# Patient Record
Sex: Male | Born: 1945 | Race: Black or African American | Hispanic: No | Marital: Married | Smoking: Never smoker
Health system: Southern US, Community
[De-identification: ages and names within clinical notes are randomized; demographics above are authoritative.]

## PROBLEM LIST (undated history)

## (undated) DIAGNOSIS — Z7409 Other reduced mobility: Secondary | ICD-10-CM

## (undated) DIAGNOSIS — M1990 Unspecified osteoarthritis, unspecified site: Secondary | ICD-10-CM

## (undated) DIAGNOSIS — D649 Anemia, unspecified: Secondary | ICD-10-CM

## (undated) DIAGNOSIS — R7303 Prediabetes: Secondary | ICD-10-CM

## (undated) DIAGNOSIS — I2510 Atherosclerotic heart disease of native coronary artery without angina pectoris: Secondary | ICD-10-CM

## (undated) DIAGNOSIS — K219 Gastro-esophageal reflux disease without esophagitis: Secondary | ICD-10-CM

## (undated) DIAGNOSIS — Z96652 Presence of left artificial knee joint: Secondary | ICD-10-CM

## (undated) DIAGNOSIS — K4090 Unilateral inguinal hernia, without obstruction or gangrene, not specified as recurrent: Secondary | ICD-10-CM

## (undated) DIAGNOSIS — I5032 Chronic diastolic (congestive) heart failure: Secondary | ICD-10-CM

## (undated) DIAGNOSIS — I10 Essential (primary) hypertension: Secondary | ICD-10-CM

## (undated) DIAGNOSIS — N529 Male erectile dysfunction, unspecified: Secondary | ICD-10-CM

## (undated) DIAGNOSIS — I890 Lymphedema, not elsewhere classified: Secondary | ICD-10-CM

## (undated) DIAGNOSIS — I739 Peripheral vascular disease, unspecified: Secondary | ICD-10-CM

## (undated) DIAGNOSIS — Z952 Presence of prosthetic heart valve: Secondary | ICD-10-CM

## (undated) DIAGNOSIS — E7800 Pure hypercholesterolemia, unspecified: Secondary | ICD-10-CM

## (undated) DIAGNOSIS — E559 Vitamin D deficiency, unspecified: Secondary | ICD-10-CM

## (undated) DIAGNOSIS — I350 Nonrheumatic aortic (valve) stenosis: Secondary | ICD-10-CM

## (undated) DIAGNOSIS — Z89511 Acquired absence of right leg below knee: Secondary | ICD-10-CM

## (undated) HISTORY — DX: Morbid (severe) obesity due to excess calories: E66.01

## (undated) HISTORY — PX: HERNIA REPAIR: SHX51

## (undated) HISTORY — DX: Presence of left artificial knee joint: Z96.652

## (undated) HISTORY — PX: TOTAL KNEE ARTHROPLASTY: SHX125

## (undated) HISTORY — DX: Vitamin D deficiency, unspecified: E55.9

## (undated) HISTORY — DX: Atherosclerotic heart disease of native coronary artery without angina pectoris: I25.10

## (undated) HISTORY — DX: Prediabetes: R73.03

## (undated) HISTORY — DX: Male erectile dysfunction, unspecified: N52.9

## (undated) HISTORY — DX: Peripheral vascular disease, unspecified: I73.9

## (undated) HISTORY — DX: Lymphedema, not elsewhere classified: I89.0

## (undated) HISTORY — DX: Gastro-esophageal reflux disease without esophagitis: K21.9

## (undated) HISTORY — DX: Acquired absence of right leg below knee: Z89.511

## (undated) HISTORY — PX: JOINT REPLACEMENT: SHX530

---
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
## 2014-02-06 HISTORY — PX: ORIF PROXIMAL TIBIAL PLATEAU FRACTURE: SUR953

## 2014-02-06 HISTORY — PX: ORIF HUMERUS FRACTURE: SHX2126

## 2014-11-05 DIAGNOSIS — T8189XS Other complications of procedures, not elsewhere classified, sequela: Secondary | ICD-10-CM | POA: Insufficient documentation

## 2015-02-02 DIAGNOSIS — D649 Anemia, unspecified: Secondary | ICD-10-CM | POA: Insufficient documentation

## 2015-02-02 DIAGNOSIS — R011 Cardiac murmur, unspecified: Secondary | ICD-10-CM | POA: Insufficient documentation

## 2015-02-02 DIAGNOSIS — L0390 Cellulitis, unspecified: Secondary | ICD-10-CM | POA: Insufficient documentation

## 2015-02-02 DIAGNOSIS — I10 Essential (primary) hypertension: Secondary | ICD-10-CM

## 2015-02-02 DIAGNOSIS — I890 Lymphedema, not elsewhere classified: Secondary | ICD-10-CM

## 2015-02-02 DIAGNOSIS — I739 Peripheral vascular disease, unspecified: Secondary | ICD-10-CM

## 2015-02-02 HISTORY — DX: Anemia, unspecified: D64.9

## 2015-02-02 HISTORY — DX: Essential (primary) hypertension: I10

## 2015-02-06 DIAGNOSIS — M869 Osteomyelitis, unspecified: Secondary | ICD-10-CM | POA: Insufficient documentation

## 2015-02-07 HISTORY — PX: BELOW KNEE LEG AMPUTATION: SUR23

## 2015-02-09 DIAGNOSIS — I350 Nonrheumatic aortic (valve) stenosis: Secondary | ICD-10-CM

## 2015-03-10 DIAGNOSIS — Z89511 Acquired absence of right leg below knee: Secondary | ICD-10-CM

## 2015-07-28 DIAGNOSIS — Z89511 Acquired absence of right leg below knee: Secondary | ICD-10-CM

## 2015-07-28 DIAGNOSIS — Z4781 Encounter for orthopedic aftercare following surgical amputation: Secondary | ICD-10-CM | POA: Insufficient documentation

## 2015-08-29 DIAGNOSIS — E876 Hypokalemia: Secondary | ICD-10-CM | POA: Insufficient documentation

## 2015-08-29 DIAGNOSIS — Z96652 Presence of left artificial knee joint: Secondary | ICD-10-CM

## 2015-08-29 DIAGNOSIS — E7800 Pure hypercholesterolemia, unspecified: Secondary | ICD-10-CM | POA: Insufficient documentation

## 2015-08-29 DIAGNOSIS — S88111A Complete traumatic amputation at level between knee and ankle, right lower leg, initial encounter: Secondary | ICD-10-CM | POA: Insufficient documentation

## 2015-08-29 DIAGNOSIS — E559 Vitamin D deficiency, unspecified: Secondary | ICD-10-CM

## 2015-08-29 DIAGNOSIS — M150 Primary generalized (osteo)arthritis: Secondary | ICD-10-CM

## 2015-08-29 DIAGNOSIS — D531 Other megaloblastic anemias, not elsewhere classified: Secondary | ICD-10-CM

## 2015-08-29 DIAGNOSIS — I5032 Chronic diastolic (congestive) heart failure: Secondary | ICD-10-CM

## 2015-08-29 DIAGNOSIS — N529 Male erectile dysfunction, unspecified: Secondary | ICD-10-CM

## 2015-08-29 DIAGNOSIS — K219 Gastro-esophageal reflux disease without esophagitis: Secondary | ICD-10-CM

## 2015-08-29 HISTORY — DX: Pure hypercholesterolemia, unspecified: E78.00

## 2015-08-29 HISTORY — DX: Chronic diastolic (congestive) heart failure: I50.32

## 2015-12-13 DIAGNOSIS — Z993 Dependence on wheelchair: Secondary | ICD-10-CM

## 2015-12-13 HISTORY — DX: Dependence on wheelchair: Z99.3

## 2016-05-29 DIAGNOSIS — I872 Venous insufficiency (chronic) (peripheral): Secondary | ICD-10-CM | POA: Insufficient documentation

## 2016-05-29 DIAGNOSIS — R609 Edema, unspecified: Secondary | ICD-10-CM

## 2016-05-29 DIAGNOSIS — R600 Localized edema: Secondary | ICD-10-CM | POA: Insufficient documentation

## 2016-08-28 DIAGNOSIS — Z2821 Immunization not carried out because of patient refusal: Secondary | ICD-10-CM

## 2016-08-28 HISTORY — DX: Immunization not carried out because of patient refusal: Z28.21

## 2017-01-09 DIAGNOSIS — I2510 Atherosclerotic heart disease of native coronary artery without angina pectoris: Principal | ICD-10-CM

## 2017-07-17 DIAGNOSIS — Z6841 Body Mass Index (BMI) 40.0 and over, adult: Secondary | ICD-10-CM | POA: Insufficient documentation

## 2017-07-17 HISTORY — DX: Body Mass Index (BMI) 40.0 and over, adult: Z684

## 2018-03-14 DIAGNOSIS — R7303 Prediabetes: Secondary | ICD-10-CM

## 2018-04-12 ENCOUNTER — Encounter: Payer: Self-pay | Admitting: Cardiology

## 2018-04-12 ENCOUNTER — Ambulatory Visit (INDEPENDENT_AMBULATORY_CARE_PROVIDER_SITE_OTHER): Payer: Medicare PPO | Admitting: Cardiology

## 2018-04-12 VITALS — BP 122/66 | HR 66 | Ht 75.0 in | Wt 330.0 lb

## 2018-04-12 DIAGNOSIS — I2510 Atherosclerotic heart disease of native coronary artery without angina pectoris: Secondary | ICD-10-CM | POA: Diagnosis not present

## 2018-04-12 DIAGNOSIS — E559 Vitamin D deficiency, unspecified: Secondary | ICD-10-CM | POA: Insufficient documentation

## 2018-04-12 DIAGNOSIS — M159 Polyosteoarthritis, unspecified: Secondary | ICD-10-CM | POA: Insufficient documentation

## 2018-04-12 DIAGNOSIS — I350 Nonrheumatic aortic (valve) stenosis: Secondary | ICD-10-CM

## 2018-04-12 DIAGNOSIS — K219 Gastro-esophageal reflux disease without esophagitis: Secondary | ICD-10-CM | POA: Insufficient documentation

## 2018-04-12 DIAGNOSIS — I5032 Chronic diastolic (congestive) heart failure: Secondary | ICD-10-CM | POA: Diagnosis not present

## 2018-04-12 DIAGNOSIS — I739 Peripheral vascular disease, unspecified: Secondary | ICD-10-CM | POA: Insufficient documentation

## 2018-04-12 DIAGNOSIS — Z89511 Acquired absence of right leg below knee: Secondary | ICD-10-CM | POA: Insufficient documentation

## 2018-04-12 DIAGNOSIS — I10 Essential (primary) hypertension: Secondary | ICD-10-CM | POA: Diagnosis not present

## 2018-04-12 DIAGNOSIS — Z96652 Presence of left artificial knee joint: Secondary | ICD-10-CM | POA: Insufficient documentation

## 2018-04-12 DIAGNOSIS — N529 Male erectile dysfunction, unspecified: Secondary | ICD-10-CM | POA: Insufficient documentation

## 2018-04-12 DIAGNOSIS — I890 Lymphedema, not elsewhere classified: Secondary | ICD-10-CM | POA: Insufficient documentation

## 2018-04-12 DIAGNOSIS — D531 Other megaloblastic anemias, not elsewhere classified: Secondary | ICD-10-CM | POA: Insufficient documentation

## 2018-04-12 DIAGNOSIS — R7303 Prediabetes: Secondary | ICD-10-CM | POA: Insufficient documentation

## 2018-04-12 NOTE — Patient Instructions (Signed)
Medication Instructions:  Your physician recommends that you continue on your current medications as directed. Please refer to the Current Medication list given to you today.  If you need a refill on your cardiac medications before your next appointment, please call your pharmacy.   Lab work: NONE If you have labs (blood work) drawn today and your tests are completely normal, you will receive your results only by: . MyChart Message (if you have MyChart) OR . A paper copy in the mail If you have any lab test that is abnormal or we need to change your treatment, we will call you to review the results.  Testing/Procedures: An EKG was performed today  Follow-Up: At [facility], you and your health needs are our priority.  As part of our continuing mission to provide you with exceptional heart care, we have created designated Provider Care Teams.  These Care Teams include your primary Cardiologist (physician) and Advanced Practice Providers (APPs -  Physician Assistants and Nurse Practitioners) who all work together to provide you with the care you need, when you need it. You will need a follow up appointment in 1 months.    

## 2018-04-12 NOTE — Progress Notes (Signed)
Cardiology Consultation:    Date:  04/12/2018   ID:  Marvin Jackson, DOB 08-Oct-1945, MRN [id number]  PCP:  Elie Goody, NP  Cardiologist:  Gypsy Balsam, MD   Referring MD: Elie Goody, NP   Chief Complaint  Patient presents with  . Coronary Artery Disease  To be established with the patient  History of Present Illness:    Marvin Jackson is a 73 y.o. male who is being seen today for the evaluation of aortic stenosis at the request of Elie Goody, NP.  He is a very complicated gentleman.  Apparently years ago he was diagnosed with aortic stenosis last estimation of degree of aortic stenosis I see from 2017 it was described as moderate to severe.  His been followed by [facility].  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  The reason for that was the fact that he got fracture then it was addressed with some hardware insertion that got infected and eventually he ended up having amputation.  He is morbidly obese he does have borderline diabetes does have hypertension.  Does not know anything about potentially having sleep apnea.he does have dyslipidemia.  Overall he is sedentary he does have prosthesis of his right lower extremity but does not use it much today he comes to my office and he is on the wheelchair.  He said he can walk with prosthesis using his walker usually short distance and that he gets tired there is no chest pain tightness squeezing pressure burning chest, there is no dizziness no passing out there is no unusual shortness of breath but again his ability to exercise is very limited.  No past medical history on file.    Current Medications: Current Meds  Medication Sig  . amLODipine (NORVASC) 10 MG tablet Take 10 mg by mouth daily.  Marland Kitchen aspirin 81 MG chewable tablet Chew 1 tablet by mouth daily.  . Cholecalciferol (VITAMIN D3) 25 MCG (1000 UT) CAPS Take 1 tablet by mouth daily.  . Cyanocobalamin (B-12 COMPLIANCE INJECTION IJ) Inject as directed every 30  (thirty) days.  . diclofenac (VOLTAREN) 75 MG EC tablet Take 1 tablet by mouth 2 (two) times daily.  . furosemide (LASIX) 40 MG tablet Take 2 tablets by mouth daily.  Marland Kitchen lisinopril (PRINIVIL,ZESTRIL) 40 MG tablet Take 40 mg by mouth daily.  Marland Kitchen omeprazole (PRILOSEC) 20 MG capsule Take 1 capsule by mouth daily.  . rosuvastatin (CRESTOR) 20 MG tablet Take 1 tablet by mouth daily.  . traMADol (ULTRAM) 50 MG tablet Take 1 tablet by mouth as needed.     Allergies:   Patient has no known allergies.   Social History   Socioeconomic History  . Marital status: Unknown    Spouse name: Not on file  . Number of children: Not on file  . Years of education: Not on file  . Highest education level: Not on file  Occupational History  . Not on file  Social Needs  . Financial resource strain: Not on file  . Food insecurity:    Worry: Not on file    Inability: Not on file  . Transportation needs:    Medical: Not on file    Non-medical: Not on file  Tobacco Use  . Smoking status: Never Smoker  . Smokeless tobacco: Former Engineer, water and Sexual Activity  . Alcohol use: Not Currently  . Drug use: Not on file  . Sexual activity: Not on file  Lifestyle  . Physical activity:  Days per week: Not on file    Minutes per session: Not on file  . Stress: Not on file  Relationships  . Social connections:    Talks on phone: Not on file    Gets together: Not on file    Attends religious service: Not on file    Active member of club or organization: Not on file    Attends meetings of clubs or organizations: Not on file    Relationship status: Not on file  Other Topics Concern  . Not on file  Social History Narrative  . Not on file     Family History: The patient's family history includes Cancer in his father; Heart disease in his father; Hypertension in his father and mother. ROS:   Please see the history of present illness.    All 14 point review of systems negative except as described per  history of present illness.  EKGs/Labs/Other Studies Reviewed:    The following studies were reviewed today: I reviewed records from [facility] last estimation of his aortic stenosis was in 2017 which was moderate to seve  EKG:  EKG is  ordered today.  The ekg ordered today demonstrates   Recent Labs: No results found for requested labs within last 8760 hours.  Recent Lipid Panel No results found for: CHOL, TRIG, HDL, CHOLHDL, VLDL, LDLCALC, LDLDIRECT  Physical Exam:    VS:  BP 122/66   Pulse 66   Ht  (1.905 m)   Wt (!) 330 lb (149.7 kg) Comment: Reported  SpO2 95%   BMI 41.25 kg/m     Wt Readings from Last 3 Encounters:  04/12/18 (!) 330 lb (149.7 kg)     GEN:  Well nourished, well developed in no acute distress HEENT: Normal NECK: No JVD; No carotid bruits LYMPHATICS: No lymphadenopathy CARDIAC: RRR, systolic ejection murmur grade 3/6 best heard at the right upper portion of the sternum.  There is also holosystolic murmur grade 2/6 best heard at left border, no rubs, no gallops RESPIRATORY:  Clear to auscultation without rales, wheezing or rhonchi  ABDOMEN: Soft, non-tender, non-distended MUSCULOSKELETAL:  No edema; No deformity  SKIN: Warm and dry NEUROLOGIC:  Alert and oriented x 3 PSYCHIATRIC:  Normal affect   ASSESSMENT:    1. Coronary artery disease involving native coronary artery of native heart without angina pectoris   2. Nonrheumatic aortic valve stenosis   3. Essential hypertension   4. Chronic diastolic heart failure (HCC)    PLAN:    In order of problems listed above:  1. Aortic stenosis which appears to be significant.  He is scheduled already today to have echocardiogram and this is the most essential test that this needed to assessment of the situation.  He is very sedentary does not have any symptoms that would suggest critical aortic stenosis however he may simply not get to the point of getting symptoms for lack of exercises.  I asked him not to  exert himself until clarify the degree of stenosis.  He tells me that years ago he had cardiac catheterization done but is not sure what was fine he was told there was nothing critical that need immediate attention. 2. Coronary artery disease have no details about this I suspect that he will have significant aortic stenosis and then we will proceed with cardiac catheterization to clarify that. 3. Essential hypertension appears to be well controlled continue present management. 4. Systolic congestive heart failure appears to be compensated. 5. Right below-knee  amputation which is related to complication of hardware implantation secondary to fracture.  He is telling me that he had some peripheral vascular disease however I have no documentation of it obviously will investigate that   Medication Adjustments/Labs and Tests Ordered: Current medicines are reviewed at length with the patient today.  Concerns regarding medicines are outlined above.  Orders Placed This Encounter  Procedures  . EKG 12-Lead   No orders of the defined types were placed in this encounter.   Signed, Georgeanna Lea, MD, [facility]. 04/12/2018 11:40 AM    [facility]

## 2018-05-08 ENCOUNTER — Telehealth: Payer: Self-pay

## 2018-05-08 NOTE — Telephone Encounter (Signed)
Called and spoke with the patient about his upcoming appointment. Due to COVID 19 he wanted to cancel his appointment and reschedule when the time is more appropriate. I advised that would be okay, and he also denied any chest pain or shortness, as well as any fevers or being around anyone sick.

## 2018-05-15 ENCOUNTER — Ambulatory Visit: Payer: Medicare PPO | Admitting: Cardiology

## 2018-07-10 ENCOUNTER — Other Ambulatory Visit: Payer: Self-pay

## 2018-07-10 ENCOUNTER — Telehealth (INDEPENDENT_AMBULATORY_CARE_PROVIDER_SITE_OTHER): Payer: Medicare PPO | Admitting: Cardiology

## 2018-07-10 ENCOUNTER — Encounter: Payer: Self-pay | Admitting: Cardiology

## 2018-07-10 DIAGNOSIS — I5032 Chronic diastolic (congestive) heart failure: Secondary | ICD-10-CM | POA: Diagnosis not present

## 2018-07-10 DIAGNOSIS — I350 Nonrheumatic aortic (valve) stenosis: Secondary | ICD-10-CM

## 2018-07-10 DIAGNOSIS — I110 Hypertensive heart disease with heart failure: Secondary | ICD-10-CM

## 2018-07-10 DIAGNOSIS — I2510 Atherosclerotic heart disease of native coronary artery without angina pectoris: Secondary | ICD-10-CM | POA: Diagnosis not present

## 2018-07-10 DIAGNOSIS — I10 Essential (primary) hypertension: Secondary | ICD-10-CM

## 2018-07-10 NOTE — Patient Instructions (Signed)
Medication Instructions:  Your physician recommends that you continue on your current medications as directed. Please refer to the Current Medication list given to you today.  If you need a refill on your cardiac medications before your next appointment, please call your pharmacy.   Lab work: None.  If you have labs (blood work) drawn today and your tests are completely normal, you will receive your results only by: Marland Kitchen MyChart Message (if you have MyChart) OR . A paper copy in the mail If you have any lab test that is abnormal or we need to change your treatment, we will call you to review the results.  Testing/Procedures: None.   Follow-Up: At [facility], you and your health needs are our priority.  As part of our continuing mission to provide you with exceptional heart care, we have created designated Provider Care Teams.  These Care Teams include your primary Cardiologist (physician) and Advanced Practice Providers (APPs -  Physician Assistants and Nurse Practitioners) who all work together to provide you with the care you need, when you need it. You will need a follow up appointment in 2 days.  Please call our office 2 months in advance to schedule this appointment.  You may see No primary care provider on file. or another member of our [employer] Provider Team in [place]: Norman Herrlich, MD . Belva Crome, MD  Any Other Special Instructions Will Be Listed Below (If Applicable).

## 2018-07-10 NOTE — Progress Notes (Signed)
Virtual Visit via Telephone Note   This visit type was conducted due to national recommendations for restrictions regarding the COVID-19 Pandemic (e.g. social distancing) in an effort to limit this patient's exposure and mitigate transmission in our community.  Due to his co-morbid illnesses, this patient is at least at moderate risk for complications without adequate follow up.  This format is felt to be most appropriate for this patient at this time.  The patient did not have access to video technology/had technical difficulties with video requiring transitioning to audio format only (telephone).  All issues noted in this document were discussed and addressed.  No physical exam could be performed with this format.  Please refer to the patient's chart for his  consent to telehealth [facility].  Evaluation Performed:  Follow-up visit  This visit type was conducted due to national recommendations for restrictions regarding the COVID-19 Pandemic (e.g. social distancing).  This format is felt to be most appropriate for this patient at this time.  All issues noted in this document were discussed and addressed.  No physical exam was performed (except for noted visual exam findings with Video Visits).  Please refer to the patient's chart (MyChart message for video visits and phone note for telephone visits) for the patient's consent to telehealth [facility].  Date:  07/10/2018  ID: Marvin Jackson, DOB September 16, 1945, MRN [id number]   Patient Location: [address] [place] [id number]   [facility]   [facility] [place] [facility]  PCP:  Elie GoodyBryan, Taressa, NP  Cardiologist:  Gypsy Balsamobert Krasowski, MD     Chief Complaint: I am doing great  History of Present Illness:    Marvin SiaJessie Aune is a 73 y.o. male  who presents via audio/video conferencing for a telehealth visit today.  He presented to me in in March to be evaluated for aortic stenosis he does have a history of aortic  stenosis last time evaluated 2017 and was assessed at that time to be moderate to severe.  He also got below-knee amputation because of trauma years ago.  We will schedule him to have echocardiogram however for some was an echocardiogram was done in the hospital in the matter of fact the same day have seen him here in the office in March we never got report of his echocardiogram.  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  He is said he is doing well denies having any symptoms there is no shortness of breath no dizziness he is happy and satisfied the way he feels   The patient does not have symptoms concerning for COVID-19 infection (fever, chills, cough, or new SHORTNESS OF BREATH).    Prior CV studies:   The following studies were reviewed today:  Echocardiogram done in the hospital revealed normal left ventricle size and function.  There is left ventricle hypertrophy.  There is severe aortic stenosis with peak and mean gradient of 73/56 mmHg, aortic valve area calculated by continuity equation 0.8 cm     No past medical history on file.     Current Meds  Medication Sig  . amLODipine (NORVASC) 10 MG tablet Take 10 mg by mouth daily.  Marland Kitchen. aspirin 81 MG chewable tablet Chew 1 tablet by mouth daily.  . Cholecalciferol (VITAMIN D3) 25 MCG (1000 UT) CAPS Take 1 tablet by mouth daily.  . Cyanocobalamin (B-12 PO) Take by mouth.  . diclofenac (VOLTAREN)  75 MG EC tablet Take 1 tablet by mouth 2 (two) times daily.  . furosemide (LASIX) 40 MG tablet Take 2 tablets by mouth daily.  Marland Kitchen lisinopril (PRINIVIL,ZESTRIL) 40 MG tablet Take 40 mg by mouth daily.  Marland Kitchen omeprazole (PRILOSEC) 20 MG capsule Take 1 capsule by mouth daily.  . rosuvastatin (CRESTOR) 20 MG tablet Take 1 tablet by mouth daily.  . traMADol (ULTRAM) 50 MG tablet Take 1 tablet by mouth as needed.      Family History: The patient's family history includes Cancer in his father;  Heart disease in his father; Hypertension in his father and mother.   ROS:   Please see the history of present illness.     All other systems reviewed and are negative.   Labs/Other Tests and Data Reviewed:     Recent Labs: No results found for requested labs within last 8760 hours.  Recent Lipid Panel No results found for: CHOL, TRIG, HDL, CHOLHDL, VLDL, LDLCALC, LDLDIRECT    Exam:    Vital Signs:  There were no vitals taken for this visit.    Wt Readings from Last 3 Encounters:  04/12/18 (!) 330 lb (149.7 kg)     Well nourished, well developed in no acute distress. Alert awake and attentive 3 happy to be able to talk to me asymptomatic.  Diagnosis for this visit:   1. Coronary artery disease involving native coronary artery of native heart without angina pectoris   2. Critical aortic valve stenosis   3. Chronic diastolic heart failure (HCC)   4. Essential hypertension      ASSESSMENT & PLAN:    1.  Coronary artery disease doing well from that point of view but it looks like he does have significant aortic stenosis cardiac catheterization need to be done to establish the diagnosis as well as look at his coronary arteries.  I talked to him over the phone he is reluctant to make a decision over the phone I will bring him back to my office on Friday and continue this conversation. 2.  Critical aortic stenosis I explained to him over the phone what the problem is however he want to come and talk in person which will make arrangements for 3.  Chronic diastolic congestive heart failure according to him he is doing well but we will see him in the office on Friday 4.  Essential hypertension controlled.  COVID-19 Education: The signs and symptoms of COVID-19 were discussed with the patient and how to seek care for testing (follow up with PCP or arrange E-visit).  The importance of social distancing was discussed today.  Patient Risk:   After full review of this patients  clinical status, I feel that they are at least moderate risk at this time.  Time:   Today, I have spent 20 minutes with the patient with telehealth technology discussing pt health issues.  I spent reviewing her chart before the visit.  Visit was finished at 4:40 PM  I actually started talking to him on the phone and then he said that he have to stop and then we continue conversation after I called him back at later time.    Medication Adjustments/Labs and Tests Ordered: Current medicines are reviewed at length with the patient today.  Concerns regarding medicines are outlined above.  No orders of the defined types were placed in this encounter.  Medication changes: No orders of the defined types were placed in this encounter.    Disposition: Follow-up in  office on Friday  Signed, Georgeanna Lea, MD, [facility] 07/10/2018 2:46 PM    [facility]

## 2018-07-12 ENCOUNTER — Ambulatory Visit (INDEPENDENT_AMBULATORY_CARE_PROVIDER_SITE_OTHER): Payer: Medicare PPO | Admitting: Cardiology

## 2018-07-12 ENCOUNTER — Encounter: Payer: Self-pay | Admitting: Cardiology

## 2018-07-12 ENCOUNTER — Other Ambulatory Visit: Payer: Self-pay

## 2018-07-12 VITALS — BP 124/62 | HR 65

## 2018-07-12 DIAGNOSIS — I350 Nonrheumatic aortic (valve) stenosis: Secondary | ICD-10-CM

## 2018-07-12 DIAGNOSIS — I5032 Chronic diastolic (congestive) heart failure: Secondary | ICD-10-CM

## 2018-07-12 DIAGNOSIS — Z89511 Acquired absence of right leg below knee: Secondary | ICD-10-CM

## 2018-07-12 DIAGNOSIS — I2510 Atherosclerotic heart disease of native coronary artery without angina pectoris: Secondary | ICD-10-CM | POA: Diagnosis not present

## 2018-07-12 DIAGNOSIS — I10 Essential (primary) hypertension: Secondary | ICD-10-CM

## 2018-07-12 DIAGNOSIS — R7303 Prediabetes: Secondary | ICD-10-CM

## 2018-07-12 NOTE — Progress Notes (Signed)
Cardiology Office Note:    Date:  07/12/2018   ID:  Marvin Jackson, DOB 09-10-1945, MRN [id number]  PCP:  Elie GoodyBryan, Taressa, NP  Cardiologist:  Gypsy Balsamobert , MD    Referring MD: Elie GoodyBryan, Taressa, NP   Chief Complaint  Patient presents with  . Follow-up  I am here to talk about my problem  History of Present Illness:    Marvin SiaJessie Jackson is a 73 y.o. male history of aortic stenosis which was evaluated few years ago and assessed as moderate to severe he returned to us to talk about his aortic stenosis.  Echocardiogram has been done which showed severe critical aortic stenosis mean gradient is 73 peak 56 calculated aortic valve area by continuity equation 0.8 cm.  I talked to him over a video link and described to him how significant the situation is however he requested to be seen so he can discuss this in more details.  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  Overall doing well denies have any chest pain tightness squeezing pressure burning chest no dizziness no passing out.  No exertional tightness in the chest.  No past medical history on file.    Current Medications: Current Meds  Medication Sig  . amLODipine (NORVASC) 10 MG tablet Take 10 mg by mouth daily.  Marland Kitchen. aspirin 81 MG chewable tablet Chew 1 tablet by mouth daily.  . Cholecalciferol (VITAMIN D3) 25 MCG (1000 UT) CAPS Take 1 tablet by mouth daily.  . Cyanocobalamin (B-12 PO) Take by mouth.  . diclofenac (VOLTAREN) 75 MG EC tablet Take 1 tablet by mouth 2 (two) times daily.  . furosemide (LASIX) 40 MG tablet Take 2 tablets by mouth daily.  Marland Kitchen. lisinopril (PRINIVIL,ZESTRIL) 40 MG tablet Take 40 mg by mouth daily.  Marland Kitchen. omeprazole  (PRILOSEC) 20 MG capsule Take 1 capsule by mouth daily.  . rosuvastatin (CRESTOR) 20 MG tablet Take 1 tablet by mouth daily.     Allergies:   Patient has no known allergies.   Social History   Socioeconomic History  . Marital status: Unknown    Spouse name: Not on file  . Number of children: Not on file  . Years of education: Not on file  . Highest education level: Not on file  Occupational History  . Not on file  Social Needs  . Financial resource strain: Not on file  . Food insecurity:    Worry: Not on file    Inability: Not on file  . Transportation needs:    Medical: Not on file    Non-medical: Not on file  Tobacco Use  . Smoking status: Never Smoker  . Smokeless tobacco: Former Engineer, waterUser  Substance and Sexual Activity  . Alcohol use: Not Currently  . Drug use: Not on file  . Sexual activity: Not on file  Lifestyle  . Physical activity:    Days per week: Not on file    Minutes per session: Not on file  . Stress: Not on file  Relationships  . Social connections:    Talks on phone: Not on file    Gets together: Not on file    Attends religious service: Not on file    Active  member of club or organization: Not on file    Attends meetings of clubs or organizations: Not on file    Relationship status: Not on file  Other Topics Concern  . Not on file  Social History Narrative  . Not on file     Family History: The patient's family history includes Cancer in his father; Heart disease in his father; Hypertension in his father and mother. ROS:   Please see the history of present illness.    All 14 point review of systems negative except as described per history of present illness  EKGs/Labs/Other Studies Reviewed:    Echocardiogram done at the hospital in April 12, 2018 revealed normal left ventricle systolic function 55% with left ventricle hypertrophy that was assessed as mild.  There was also some degree of asymmetrical septal hypertrophy with mild left ventricular  outflow tract obstruction.  Diastolic filling pattern indicates impaired relaxation, left atrium was mildly dilated.  There was severe aortic stenosis with peak to mean pressure gradient of 73 mmHg and 56 mmHg.  Aortic valve area by continuity equation was 0.8 cm   Recent Labs: No results found for requested labs within last 8760 hours.  Recent Lipid Panel No results found for: CHOL, TRIG, HDL, CHOLHDL, VLDL, LDLCALC, LDLDIRECT  Physical Exam:    VS:  BP 124/62   Pulse 65   SpO2 99%     Wt Readings from Last 3 Encounters:  04/12/18 (!) 330 lb (149.7 kg)     GEN:  Well nourished, well developed in no acute distress HEENT: Normal NECK: No JVD; No carotid bruits LYMPHATICS: No lymphadenopathy CARDIAC: RRR, systolic ejection late peaking murmur grade 3/6 to 4/6 best heard at the right upper portion of the sternum.  S2 is missing, no rubs, no gallops RESPIRATORY:  Clear to auscultation without rales, wheezing or rhonchi  ABDOMEN: Soft, non-tender, non-distended MUSCULOSKELETAL:  No edema; No deformity  SKIN: Warm and dry LOWER EXTREMITIES: no swelling NEUROLOGIC:  Alert and oriented x 3 PSYCHIATRIC:  Normal affect   ASSESSMENT:    1. Chronic diastolic heart failure (HCC)   2. Coronary artery disease involving native coronary artery of native heart without angina pectoris   3. Essential hypertension   4. Nonrheumatic aortic valve stenosis   5. Critical aortic valve stenosis   6. Prediabetes   7. 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 Critical aortic stenosis.  I spent a great deal of time explaining to him what the problem is and I told him the only way to fix this problem is to be to do a surgical valve replacement or TAVR.  We discussed both options in details.  I told him also that the first step of evaluating should be cardiac catheterization.  I explained the rationale for this procedure as well as I  described procedure to him all risk benefits as well as alternatives.  He agreed to proceed. 2. Essential hypertension appears to be well controlled continue present management. 3. Prediabetes will do by recent test before cardiac catheterization. 4. Acquired absence of right lower extremities noted.   Medication Adjustments/Labs and Tests Ordered: Current medicines are reviewed at length with the patient today.  Concerns regarding medicines are outlined above.  Orders Placed This Encounter  Procedures  . DG Chest 2 View  . CBC  . Basic metabolic panel  . EKG 12-Lead   Medication changes:  No orders of the defined types were placed in this encounter.   Signed, Georgeanna Lea, MD, [facility] 07/12/2018 1:55 PM    [facility]

## 2018-07-12 NOTE — H&P (View-Only) (Signed)
Cardiology Office Note:    Date:  07/12/2018   ID:  Marvin SiaJessie Pinkus, DOB 09-10-1945, MRN [id number]  PCP:  Elie GoodyBryan, Taressa, NP  Cardiologist:  Gypsy Balsamobert Shametra Cumberland, MD    Referring MD: Elie GoodyBryan, Taressa, NP   Chief Complaint  Patient presents with  . Follow-up  I am here to talk about my problem  History of Present Illness:    Marvin Jackson is a 73 y.o. male history of aortic stenosis which was evaluated few years ago and assessed as moderate to severe he returned to us to talk about his aortic stenosis.  Echocardiogram has been done which showed severe critical aortic stenosis mean gradient is 73 peak 56 calculated aortic valve area by continuity equation 0.8 cm.  I talked to him over a video link and described to him how significant the situation is however he requested to be seen so he can discuss this in more details.  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  Overall doing well denies have any chest pain tightness squeezing pressure burning chest no dizziness no passing out.  No exertional tightness in the chest.  No past medical history on file.    Current Medications: Current Meds  Medication Sig  . amLODipine (NORVASC) 10 MG tablet Take 10 mg by mouth daily.  Marland Kitchen. aspirin 81 MG chewable tablet Chew 1 tablet by mouth daily.  . Cholecalciferol (VITAMIN D3) 25 MCG (1000 UT) CAPS Take 1 tablet by mouth daily.  . Cyanocobalamin (B-12 PO) Take by mouth.  . diclofenac (VOLTAREN) 75 MG EC tablet Take 1 tablet by mouth 2 (two) times daily.  . furosemide (LASIX) 40 MG tablet Take 2 tablets by mouth daily.  Marland Kitchen. lisinopril (PRINIVIL,ZESTRIL) 40 MG tablet Take 40 mg by mouth daily.  Marland Kitchen. omeprazole  (PRILOSEC) 20 MG capsule Take 1 capsule by mouth daily.  . rosuvastatin (CRESTOR) 20 MG tablet Take 1 tablet by mouth daily.     Allergies:   Patient has no known allergies.   Social History   Socioeconomic History  . Marital status: Unknown    Spouse name: Not on file  . Number of children: Not on file  . Years of education: Not on file  . Highest education level: Not on file  Occupational History  . Not on file  Social Needs  . Financial resource strain: Not on file  . Food insecurity:    Worry: Not on file    Inability: Not on file  . Transportation needs:    Medical: Not on file    Non-medical: Not on file  Tobacco Use  . Smoking status: Never Smoker  . Smokeless tobacco: Former Engineer, [employer]  Substance and Sexual Activity  . Alcohol use: Not Currently  . Drug use: Not on file  . Sexual activity: Not on file  Lifestyle  . Physical activity:    Days per week: Not on file    Minutes per session: Not on file  . Stress: Not on file  Relationships  . Social connections:    Talks on phone: Not on file    Gets together: Not on file    Attends religious service: Not on file    Active  member of club or organization: Not on file    Attends meetings of clubs or organizations: Not on file    Relationship status: Not on file  Other Topics Concern  . Not on file  Social History Narrative  . Not on file     Family History: The patient's family history includes Cancer in his father; Heart disease in his father; Hypertension in his father and mother. ROS:   Please see the history of present illness.    All 14 point review of systems negative except as described per history of present illness  EKGs/Labs/Other Studies Reviewed:    Echocardiogram done at the hospital in April 12, 2018 revealed normal left ventricle systolic function 55% with left ventricle hypertrophy that was assessed as mild.  There was also some degree of asymmetrical septal hypertrophy with mild left ventricular  outflow tract obstruction.  Diastolic filling pattern indicates impaired relaxation, left atrium was mildly dilated.  There was severe aortic stenosis with peak to mean pressure gradient of 73 mmHg and 56 mmHg.  Aortic valve area by continuity equation was 0.8 cm   Recent Labs: No results found for requested labs within last 8760 hours.  Recent Lipid Panel No results found for: CHOL, TRIG, HDL, CHOLHDL, VLDL, LDLCALC, LDLDIRECT  Physical Exam:    VS:  BP 124/62   Pulse 65   SpO2 99%     Wt Readings from Last 3 Encounters:  04/12/18 (!) 330 lb (149.7 kg)     GEN:  Well nourished, well developed in no acute distress HEENT: Normal NECK: No JVD; No carotid bruits LYMPHATICS: No lymphadenopathy CARDIAC: RRR, systolic ejection late peaking murmur grade 3/6 to 4/6 best heard at the right upper portion of the sternum.  S2 is missing, no rubs, no gallops RESPIRATORY:  Clear to auscultation without rales, wheezing or rhonchi  ABDOMEN: Soft, non-tender, non-distended MUSCULOSKELETAL:  No edema; No deformity  SKIN: Warm and dry LOWER EXTREMITIES: no swelling NEUROLOGIC:  Alert and oriented x 3 PSYCHIATRIC:  Normal affect   ASSESSMENT:    1. Chronic diastolic heart failure (HCC)   2. Coronary artery disease involving native coronary artery of native heart without angina pectoris   3. Essential hypertension   4. Nonrheumatic aortic valve stenosis   5. Critical aortic valve stenosis   6. Prediabetes   7. 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 Critical aortic stenosis.  I spent a great deal of time explaining to him what the problem is and I told him the only way to fix this problem is to be to do a surgical valve replacement or TAVR.  We discussed both options in details.  I told him also that the first step of evaluating should be cardiac catheterization.  I explained the rationale for this procedure as well as I  described procedure to him all risk benefits as well as alternatives.  He agreed to proceed. 2. Essential hypertension appears to be well controlled continue present management. 3. Prediabetes will do by recent test before cardiac catheterization. 4. Acquired absence of right lower extremities noted.   Medication Adjustments/Labs and Tests Ordered: Current medicines are reviewed at length with the patient today.  Concerns regarding medicines are outlined above.  Orders Placed This Encounter  Procedures  . DG Chest 2 View  . CBC  . Basic metabolic panel  . EKG 12-Lead   Medication changes:   No orders of the defined types were placed in this encounter.   Signed, Georgeanna Lea, MD, [facility] 07/12/2018 1:55 PM    [facility]

## 2018-07-12 NOTE — Patient Instructions (Signed)
Medication Instructions:  Your physician recommends that you continue on your current medications as directed. Please refer to the Current Medication list given to you today.  If you need a refill on your cardiac medications before your next appointment, please call your pharmacy.   Lab work: Your physician recommends that you return for lab work today at [facility]: bmp, cbc   If you have labs (blood work) drawn today and your tests are completely normal, you will receive your results only by:  MyChart Message (if you have MyChart) OR  A paper copy in the mail If you have any lab test that is abnormal or we need to change your treatment, we will call you to review the results.  Testing/Procedures: A chest x-ray takes a picture of the organs and structures inside the chest, including the heart, lungs, and blood vessels. This test can show several things, including, whether the heart is enlarges; whether fluid is building up in the lungs; and whether pacemaker / defibrillator leads are still in place.     [facility] CARDIOVASCULAR DIVISION [facility] AT [facility] [address] [place] [address] Dept: [phone] Loc: [phone]  Marvin Jackson  07/12/2018  You are scheduled for a Cardiac Catheterization on Monday, June 15 with Dr. Tonny Bollman.  1. Please arrive at the [facility] (Main Entrance A) at [facility]: [address] [place], [place] [address] at 7:00 AM (This time is two hours before your procedure to ensure your preparation). Free valet parking service is available.   Special note: Every effort is made to have your procedure done on time. Please understand that emergencies sometimes delay scheduled procedures.  2. Diet: Do not eat solid foods after midnight.  The patient may have clear liquids until 5am upon the day of the procedure.  3. Labs: You will need to have blood today.  4. Medication instructions in  preparation for your procedure:  Hold: Lasix the day of the test.   On the morning of your procedure, take your Aspirin and any morning medicines NOT listed above.  You may use sips of water.  5. Plan for one night stay--bring personal belongings. 6. Bring a current list of your medications and current insurance cards. 7. You MUST have a responsible person to drive you home. 8. Someone MUST be with you the first 24 hours after you arrive home or your discharge will be delayed. 9. Please wear clothes that are easy to get on and off and wear slip-on shoes.  Thank you for allowing [place] to care for you!   -- [facility]   Follow-Up: At [facility], you and your health needs are our priority.  As part of our continuing mission to provide you with exceptional heart care, we have created designated Provider Care Teams.  These Care Teams include your primary Cardiologist (physician) and Advanced Practice Providers (APPs -  Physician Assistants and Nurse Practitioners) who all work together to provide you with the care you need, when you need it. You will need a follow up appointment in 1 months.  Please call our office 2 months in advance to schedule this appointment.  You may see No primary care provider on file. or another member of our [employer] Provider Team in [place]: Norman Herrlich, MD  Belva Crome, MD  Any Other Special Instructions Will Be Listed Below (If Applicable).    Coronary Angiogram With Stent Coronary angiogram with stent  placement is a procedure to widen or open a narrow blood vessel of the heart (coronary artery). Arteries may become blocked by cholesterol buildup (plaques) in the lining of the wall. When a coronary artery becomes partially blocked, blood flow to that area decreases. This may lead to chest pain or a heart attack (myocardial infarction). A stent is a small piece of metal that looks like mesh or a spring. Stent placement  may be done as treatment for a heart attack or right after a coronary angiogram in which a blocked artery is found. Let your health care provider know about:  Any allergies you have.  All medicines you are taking, including vitamins, herbs, eye drops, creams, and over-the-counter medicines.  Any problems you or family members have had with anesthetic medicines.  Any blood disorders you have.  Any surgeries you have had.  Any medical conditions you have.  Whether you are pregnant or may be pregnant. What are the risks? Generally, this is a safe procedure. However, problems may occur, including:  Damage to the heart or its blood vessels.  A return of blockage.  Bleeding, infection, or bruising at the insertion site.  A collection of blood under the skin (hematoma) at the insertion site.  A blood clot in another part of the body.  Kidney injury.  Allergic reaction to the dye or contrast that is used.  Bleeding into the abdomen (retroperitoneal bleeding). What happens before the procedure? Staying hydrated Follow instructions from your health care provider about hydration, which may include:  Up to 2 hours before the procedure - you may continue to drink clear liquids, such as water, clear fruit juice, black coffee, and plain tea.  Eating and drinking restrictions Follow instructions from your health care provider about eating and drinking, which may include:  8 hours before the procedure - stop eating heavy meals or foods such as meat, fried foods, or fatty foods.  6 hours before the procedure - stop eating light meals or foods, such as toast or cereal.  2 hours before the procedure - stop drinking clear liquids. Ask your health care provider about:  Changing or stopping your regular medicines. This is especially important if you are taking diabetes medicines or blood thinners.  Taking medicines such as ibuprofen. These medicines can thin your blood. Do not take these  medicines before your procedure if your health care provider instructs you not to. Generally, aspirin is recommended before a procedure of passing a small, thin tube (catheter) through a blood vessel and into the heart (cardiac catheterization). What happens during the procedure?   An IV tube will be inserted into one of your veins.  You will be given one or more of the following: ? A medicine to help you relax (sedative). ? A medicine to numb the area where the catheter will be inserted into an artery (local anesthetic).  To reduce your risk of infection: ? Your health care team will wash or sanitize their hands. ? Your skin will be washed with soap. ? Hair may be removed from the area where the catheter will be inserted.  Using a guide wire, the catheter will be inserted into an artery. The location may be in your groin, in your wrist, or in the fold of your arm (near your elbow).  A type of X-ray (fluoroscopy) will be used to help guide the catheter to the opening of the arteries in the heart.  A dye will be injected into the catheter, and  X-rays will be taken. The dye will help to show where any narrowing or blockages are located in the arteries.  A tiny wire will be guided to the blocked spot, and a balloon will be inflated to make the artery wider.  The stent will be expanded and will crush the plaques into the wall of the vessel. The stent will hold the area open and improve the blood flow. Most stents have a drug coating to reduce the risk of the stent narrowing over time.  The artery may be made wider using a drill, laser, or other tools to remove plaques.  When the blood flow is better, the catheter will be removed. The lining of the artery will grow over the stent, which stays where it was placed. This procedure may vary among health care providers and hospitals. What happens after the procedure?  If the procedure is done through the leg, you will be kept in bed lying flat  for about 6 hours. You will be instructed to not bend and not cross your legs.  The insertion site will be checked frequently.  The pulse in your foot or wrist will be checked frequently.  You may have additional blood tests, X-rays, and a test that records the electrical activity of your heart (electrocardiogram, or ECG). This information is not intended to replace advice given to you by your health care provider. Make sure you discuss any questions you have with your health care provider. Document Released: 07/30/2002 Document Revised: 05/04/2017 Document Reviewed: 08/29/2015 Elsevier Interactive Patient Education  2019 Elsevier Inc.    Chest X-Ray  A chest X-ray is a painless test that uses radiation to create images of the structures inside of your chest. Chest X-rays are used to look for many health conditions, including heart failure, pneumonia, tuberculosis, rib fractures, breathing disorders, and cancer. They may be used to diagnose chest pain, constant coughing, or trouble breathing. Tell a health care provider about:  Any allergies you have.  All medicines you are taking, including vitamins, herbs, eye drops, creams, and over-the-counter medicines.  Any surgeries you have had.  Any medical conditions you have.  Whether you are pregnant or may be pregnant. What are the risks? Getting a chest X-ray is a safe procedure. However, you will be exposed to a small amount of radiation. Being exposed to too much radiation over a lifetime can increase the risk of cancer. This risk is small, but it may occur if you have many X-rays throughout your life. What happens before the procedure?  You may be asked to remove glasses, jewelry, and any other metal objects.  You will be asked to undress from the waist up. You may be given a hospital gown to wear.  You may be asked to wear a protective lead apron to protect parts of your body from radiation. What happens during the  procedure?  You will be asked to stand still as each picture is taken to get the best possible images.  You will be asked to take a deep breath and hold your breath for a few seconds.  The X-ray machine will create a picture of your chest using a tiny burst of radiation. This is painless.  More pictures may be taken from other angles. Typically, one picture will be taken while you face the X-ray camera, and another picture will be taken from the side while you stand. If you cannot stand, you may be asked to lie down. The procedure may vary among  health care providers and hospitals. What happens after the procedure?  The X-ray(s) will be reviewed by your health care provider or an X-ray (radiology) specialist.  It is up to you to get your test results. Ask your health care provider, or the department that is doing the test, when your results will be ready.  Your health care provider will tell you if you need more tests or a follow-up exam. Keep all follow-up visits as told by your health care provider. This is important. Summary  A chest X-ray is a safe, painless test that is used to examine the inside of the chest, heart, and lungs.  You will need to undress from the waist up and remove jewelry and metal objects before the procedure.  You will be exposed to a small amount of radiation during the procedure.  The X-ray machine will take one or more pictures of your chest while you remain as still as possible.  Later, a health care provider or specialist will review the test results with you. This information is not intended to replace advice given to you by your health care provider. Make sure you discuss any questions you have with your health care provider. Document Released: 03/21/2016 Document Revised: 03/21/2016 Document Reviewed: 03/21/2016 Elsevier Interactive Patient Education  [employer]2019 Elsevier Inc.

## 2018-07-18 ENCOUNTER — Telehealth: Payer: Self-pay | Admitting: *Deleted

## 2018-07-18 NOTE — Telephone Encounter (Signed)
Pt contacted pre-catheterization scheduled at [facility] for: Monday July 22, 2018 9 AM Verified arrival time and place: [place] Entrance A at: 7 AM  Covid-19 test date: 07/19/18 [facility]  No solid food after midnight prior to cath, clear liquids until 5 AM day of procedure. Contrast allergy: no  Hold: Lasix -AM of procedure  Except hold medications AM meds can be  taken pre-cath with sip of water including: ASA 81 mg  Confirmed patient has responsible person to drive home post procedure and observe 24 hours after arriving home:yes   Due to Covid-19 pandemic no visitors are allowed in the hospital (unless cognitive impairment).  Their designated party will be called when their procedure is over for an update and to arrange pick up.  Patients are required to wear a mask when they enter the hospital.      COVID-19 Pre-Screening Questions:  . In the past 7 to 10 days have you had a cough,  shortness of breath, headache, congestion, fever (100 or greater) body aches, chills, sore throat, or sudden loss of taste or sense of smell? no . Have you been around anyone with known Covid 19? no . Have you been around anyone who is awaiting Covid 19 test results in the past 7 to 10 days? no . Have you been around anyone who has been exposed to Covid 19, or has mentioned symptoms of Covid 19 within the past 7 to 10 days? no   I reviewed procedure/mask/visitor/Covid-19 screening questions with patient, he verbalized understanding.

## 2018-07-19 ENCOUNTER — Other Ambulatory Visit (HOSPITAL_COMMUNITY)
Admission: RE | Admit: 2018-07-19 | Discharge: 2018-07-19 | Disposition: A | Discharge status: Home / Self Care | Payer: Medicare PPO | Source: Ambulatory Visit | Attending: Cardiovascular Disease | Admitting: Cardiovascular Disease

## 2018-07-19 DIAGNOSIS — Z1159 Encounter for screening for other viral diseases: Secondary | ICD-10-CM | POA: Insufficient documentation

## 2018-07-19 DIAGNOSIS — Z01812 Encounter for preprocedural laboratory examination: Secondary | ICD-10-CM | POA: Diagnosis present

## 2018-07-20 LAB — NOVEL CORONAVIRUS, NAA (HOSP ORDER, SEND-OUT TO REF LAB; TAT 18-24 HRS): SARS-CoV-2, NAA: NOT DETECTED

## 2018-07-22 ENCOUNTER — Ambulatory Visit (HOSPITAL_BASED_OUTPATIENT_CLINIC_OR_DEPARTMENT_OTHER): Payer: Medicare PPO

## 2018-07-22 ENCOUNTER — Encounter (HOSPITAL_COMMUNITY)
Admission: RE | Disposition: A | Discharge status: Home / Self Care | Payer: Medicare PPO | Source: Home / Self Care | Attending: Cardiovascular Disease

## 2018-07-22 ENCOUNTER — Ambulatory Visit (HOSPITAL_COMMUNITY)
Admission: RE | Admit: 2018-07-22 | Discharge: 2018-07-22 | Disposition: A | Discharge status: Home / Self Care | Payer: Medicare PPO | Attending: Cardiovascular Disease | Admitting: Cardiovascular Disease

## 2018-07-22 ENCOUNTER — Other Ambulatory Visit: Payer: Self-pay

## 2018-07-22 DIAGNOSIS — R7303 Prediabetes: Secondary | ICD-10-CM | POA: Diagnosis not present

## 2018-07-22 DIAGNOSIS — I350 Nonrheumatic aortic (valve) stenosis: Secondary | ICD-10-CM | POA: Diagnosis present

## 2018-07-22 DIAGNOSIS — I5032 Chronic diastolic (congestive) heart failure: Secondary | ICD-10-CM | POA: Insufficient documentation

## 2018-07-22 DIAGNOSIS — I110 Hypertensive heart disease with heart failure: Secondary | ICD-10-CM | POA: Diagnosis not present

## 2018-07-22 DIAGNOSIS — Z8249 Family history of ischemic heart disease and other diseases of the circulatory system: Secondary | ICD-10-CM | POA: Diagnosis not present

## 2018-07-22 DIAGNOSIS — I2510 Atherosclerotic heart disease of native coronary artery without angina pectoris: Secondary | ICD-10-CM | POA: Insufficient documentation

## 2018-07-22 DIAGNOSIS — Z89511 Acquired absence of right leg below knee: Secondary | ICD-10-CM | POA: Diagnosis not present

## 2018-07-22 DIAGNOSIS — Z79899 Other long term (current) drug therapy: Secondary | ICD-10-CM | POA: Insufficient documentation

## 2018-07-22 DIAGNOSIS — Z791 Long term (current) use of non-steroidal anti-inflammatories (NSAID): Secondary | ICD-10-CM | POA: Insufficient documentation

## 2018-07-22 DIAGNOSIS — Z7982 Long term (current) use of aspirin: Secondary | ICD-10-CM | POA: Insufficient documentation

## 2018-07-22 DIAGNOSIS — Z6841 Body Mass Index (BMI) 40.0 and over, adult: Secondary | ICD-10-CM | POA: Diagnosis not present

## 2018-07-22 HISTORY — PX: RIGHT/LEFT HEART CATH AND CORONARY ANGIOGRAPHY: CATH118266

## 2018-07-22 LAB — POCT I-STAT 7, (LYTES, BLD GAS, ICA,H+H)
Bicarbonate: 25.9 mmol/L (ref 20.0–28.0)
Calcium, Ion: 1.24 mmol/L (ref 1.15–1.40)
HCT: 36 % — ABNORMAL LOW (ref 39.0–52.0)
Hemoglobin: 12.2 g/dL — ABNORMAL LOW (ref 13.0–17.0)
O2 Saturation: 99 %
Potassium: 4 mmol/L (ref 3.5–5.1)
Sodium: 142 mmol/L (ref 135–145)
TCO2: 27 mmol/L (ref 22–32)
pCO2 arterial: 45.2 mmHg (ref 32.0–48.0)
pH, Arterial: 7.366 (ref 7.350–7.450)
pO2, Arterial: 127 mmHg — ABNORMAL HIGH (ref 83.0–108.0)

## 2018-07-22 LAB — ECHOCARDIOGRAM COMPLETE
Height: 75 in
Weight: 5360 oz

## 2018-07-22 LAB — POCT I-STAT EG7
Bicarbonate: 26.7 mmol/L (ref 20.0–28.0)
Calcium, Ion: 1.23 mmol/L (ref 1.15–1.40)
HCT: 36 % — ABNORMAL LOW (ref 39.0–52.0)
Hemoglobin: 12.2 g/dL — ABNORMAL LOW (ref 13.0–17.0)
O2 Saturation: 73 %
Potassium: 3.9 mmol/L (ref 3.5–5.1)
Sodium: 143 mmol/L (ref 135–145)
TCO2: 28 mmol/L (ref 22–32)
pCO2, Ven: 49.3 mmHg (ref 44.0–60.0)
pH, Ven: 7.341 (ref 7.250–7.430)
pO2, Ven: 41 mmHg (ref 32.0–45.0)

## 2018-07-22 SURGERY — RIGHT/LEFT HEART CATH AND CORONARY ANGIOGRAPHY
Anesthesia: LOCAL

## 2018-07-22 MED ORDER — MIDAZOLAM HCL 2 MG/2ML IJ SOLN
INTRAMUSCULAR | Status: AC
  Filled 2018-07-22: qty 2

## 2018-07-22 MED ORDER — SODIUM CHLORIDE 0.9% FLUSH: 3.0000 mL | INTRAVENOUS | Status: DC | PRN

## 2018-07-22 MED ORDER — ACETAMINOPHEN 325 MG PO TABS: 650.0000 mg | ORAL_TABLET | ORAL | Status: DC | PRN

## 2018-07-22 MED ORDER — SODIUM CHLORIDE 0.9 % WEIGHT BASED INFUSION: 1.0000 mL/kg/h | INTRAVENOUS | Status: DC

## 2018-07-22 MED ORDER — VERAPAMIL HCL 2.5 MG/ML IV SOLN
INTRAVENOUS | Status: AC
  Filled 2018-07-22: qty 2

## 2018-07-22 MED ORDER — SODIUM CHLORIDE 0.9 % IV SOLN: 250.0000 mL | INTRAVENOUS | Status: DC | PRN

## 2018-07-22 MED ORDER — SODIUM CHLORIDE 0.9 % WEIGHT BASED INFUSION: 3.0000 mL/kg/h | INTRAVENOUS | Status: AC

## 2018-07-22 MED ORDER — HEPARIN SODIUM (PORCINE) 1000 UNIT/ML IJ SOLN
INTRAMUSCULAR | Status: DC | PRN
  Administered 2018-07-22: 6000 [IU] via INTRAVENOUS

## 2018-07-22 MED ORDER — LIDOCAINE HCL (PF) 1 % IJ SOLN
INTRAMUSCULAR | Status: AC
  Filled 2018-07-22: qty 30

## 2018-07-22 MED ORDER — MIDAZOLAM HCL 2 MG/2ML IJ SOLN
INTRAMUSCULAR | Status: DC | PRN
  Administered 2018-07-22: 0.5 mg via INTRAVENOUS
  Administered 2018-07-22 (×2): 1 mg via INTRAVENOUS

## 2018-07-22 MED ORDER — ONDANSETRON HCL 4 MG/2ML IJ SOLN: 4.0000 mg | Freq: Four times a day (QID) | INTRAMUSCULAR | Status: DC | PRN

## 2018-07-22 MED ORDER — HEPARIN SODIUM (PORCINE) 1000 UNIT/ML IJ SOLN
INTRAMUSCULAR | Status: AC
  Filled 2018-07-22: qty 1

## 2018-07-22 MED ORDER — HEPARIN (PORCINE) IN NACL 1000-0.9 UT/500ML-% IV SOLN
INTRAVENOUS | Status: DC | PRN
  Administered 2018-07-22 (×2): 500 mL

## 2018-07-22 MED ORDER — PERFLUTREN LIPID MICROSPHERE
4.0000 mL | Freq: Once | INTRAVENOUS | Status: AC
  Administered 2018-07-22: 13:00:00 4 mL via INTRAVENOUS

## 2018-07-22 MED ORDER — ASPIRIN 81 MG PO CHEW: 81.0000 mg | CHEWABLE_TABLET | ORAL | Status: DC

## 2018-07-22 MED ORDER — LABETALOL HCL 5 MG/ML IV SOLN: 10.0000 mg | INTRAVENOUS | Status: DC | PRN

## 2018-07-22 MED ORDER — VERAPAMIL HCL 2.5 MG/ML IV SOLN
INTRAVENOUS | Status: DC | PRN
  Administered 2018-07-22: 10 mL via INTRA_ARTERIAL

## 2018-07-22 MED ORDER — FENTANYL CITRATE (PF) 100 MCG/2ML IJ SOLN
INTRAMUSCULAR | Status: DC | PRN
  Administered 2018-07-22 (×3): 25 ug via INTRAVENOUS

## 2018-07-22 MED ORDER — IOHEXOL 350 MG/ML SOLN
INTRAVENOUS | Status: DC | PRN
  Administered 2018-07-22: 95 mL via INTRAVENOUS

## 2018-07-22 MED ORDER — HEPARIN (PORCINE) IN NACL 1000-0.9 UT/500ML-% IV SOLN
INTRAVENOUS | Status: AC
  Filled 2018-07-22: qty 1000

## 2018-07-22 MED ORDER — LIDOCAINE HCL (PF) 1 % IJ SOLN
INTRAMUSCULAR | Status: DC | PRN
  Administered 2018-07-22 (×3): 2 mL

## 2018-07-22 MED ORDER — HYDRALAZINE HCL 20 MG/ML IJ SOLN: 10.0000 mg | INTRAMUSCULAR | Status: DC | PRN

## 2018-07-22 MED ORDER — SODIUM CHLORIDE 0.9% FLUSH: 3.0000 mL | Freq: Two times a day (BID) | INTRAVENOUS | Status: DC

## 2018-07-22 MED ORDER — FENTANYL CITRATE (PF) 100 MCG/2ML IJ SOLN
INTRAMUSCULAR | Status: AC
  Filled 2018-07-22: qty 2

## 2018-07-22 SURGICAL SUPPLY — 17 items
CATH 5FR JL3.5 JR4 ANG PIG MP (CATHETERS) ×2 IMPLANT
CATH BALLN WEDGE 5F 110CM (CATHETERS) ×2 IMPLANT
CATH INFINITI 5 FR AL2 (CATHETERS) ×2 IMPLANT
CATH INFINITI 5 FR AR1 MOD (CATHETERS) ×2 IMPLANT
CATH LAUNCHER 6FR EBU3.5 (CATHETERS) ×2 IMPLANT
CATH VISTA GUIDE 6FR JR4 (CATHETERS) ×2 IMPLANT
DEVICE RAD COMP TR BAND LRG (VASCULAR PRODUCTS) ×2 IMPLANT
GLIDESHEATH SLEND SS 6F .021 (SHEATH) ×2 IMPLANT
GUIDEWIRE INQWIRE 1.5J.035X260 (WIRE) ×1 IMPLANT
INQWIRE 1.5J .035X260CM (WIRE) ×2
KIT ESSENTIALS PG (KITS) ×2 IMPLANT
KIT HEART LEFT (KITS) ×2 IMPLANT
PACK CARDIAC CATHETERIZATION (CUSTOM PROCEDURE TRAY) ×2 IMPLANT
SHEATH GLIDE SLENDER 4/5FR (SHEATH) ×2 IMPLANT
TRANSDUCER W/STOPCOCK (MISCELLANEOUS) ×2 IMPLANT
TUBING CIL FLEX 10 FLL-RA (TUBING) ×2 IMPLANT
WIRE EMERALD ST .035X150CM (WIRE) ×2 IMPLANT

## 2018-07-22 NOTE — Interval H&P Note (Signed)
History and Physical Interval Note:  07/22/2018 9:04 AM  Marvin Jackson  has presented today for surgery, with the diagnosis of Aortic stenosis.  The various methods of treatment have been discussed with the patient and family. After consideration of risks, benefits and other options for treatment, the patient has consented to  Procedure(s): RIGHT/LEFT HEART CATH AND CORONARY ANGIOGRAPHY (N/A) as a surgical intervention.  The patient's history has been reviewed, patient examined, no change in status, stable for surgery.  I have reviewed the patient's chart and labs.  Questions were answered to the patient's satisfaction.     Sherren Mocha

## 2018-07-22 NOTE — Discharge Instructions (Signed)
bRadial Site Care  This sheet gives you information about how to care for yourself after your procedure. Your health care provider may also give you more specific instructions. If you have problems or questions, contact your health care provider. What can I expect after the procedure? After the procedure, it is common to have:  Bruising and tenderness at the catheter insertion area. Follow these instructions at home: Medicines  Take over-the-counter and prescription medicines only as told by your health care provider. Insertion site care  Follow instructions from your health care provider about how to take care of your insertion site. Make sure you: ? Wash your hands with soap and water before you change your bandage (dressing). If soap and water are not available, use hand sanitizer. ? Change your dressing as told by your health care provider. ? Leave stitches (sutures), skin glue, or adhesive strips in place. These skin closures may need to stay in place for 2 weeks or longer. If adhesive strip edges start to loosen and curl up, you may trim the loose edges. Do not remove adhesive strips completely unless your health care provider tells you to do that.  Check your insertion site every day for signs of infection. Check for: ? Redness, swelling, or pain. ? Fluid or blood. ? Pus or a bad smell. ? Warmth.  Do not take baths, swim, or use a hot tub until your health care provider approves.  You may shower 24-48 hours after the procedure, or as directed by your health care provider. ? Remove the dressing and gently wash the site with plain soap and water. ? Pat the area dry with a clean towel. ? Do not rub the site. That could cause bleeding.  Do not apply powder or lotion to the site. Activity   For 24 hours after the procedure, or as directed by your health care provider: ? Do not flex or bend the affected arm. ? Do not push or pull heavy objects with the affected arm. ? Do not  drive yourself home from the hospital or clinic. You may drive 24 hours after the procedure unless your health care provider tells you not to. ? Do not operate machinery or power tools.  Do not lift anything that is heavier than 10 lb (4.5 kg), or the limit that you are told, until your health care provider says that it is safe.  Ask your health care provider when it is okay to: ? Return to work or school. ? Resume usual physical activities or sports. ? Resume sexual activity. General instructions  If the catheter site starts to bleed, raise your arm and put firm pressure on the site. If the bleeding does not stop, get help right away. This is a medical emergency.  If you went home on the same day as your procedure, a responsible adult should be with you for the first 24 hours after you arrive home.  Keep all follow-up visits as told by your health care provider. This is important. Contact a health care provider if:  You have a fever.  You have redness, swelling, or yellow drainage around your insertion site. Get help right away if:  You have unusual pain at the radial site.  The catheter insertion area swells very fast.  The insertion area is bleeding, and the bleeding does not stop when you hold steady pressure on the area.  Your arm or hand becomes pale, cool, tingly, or numb. These symptoms may represent a serious problem  that is an emergency. Do not wait to see if the symptoms will go away. Get medical help right away. Call your local emergency services (911 in the [place] Do not drive yourself to the hospital. °Summary °· After the procedure, it is common to have bruising and tenderness at the site. °· Follow instructions from your health care provider about how to take care of your radial site wound. Check the wound every day for signs of infection. °· Do not lift anything that is heavier than 10 lb (4.5 kg), or the limit that you are told, until your health care provider says  that it is safe. °This information is not intended to replace advice given to you by your health care provider. Make sure you discuss any questions you have with your health care provider. °Document Released: 02/25/2010 Document Revised: 02/28/2017 Document Reviewed: 02/28/2017 °[employer] Interactive Patient Education © 2019 [employer] ° °

## 2018-07-23 ENCOUNTER — Encounter (HOSPITAL_COMMUNITY): Payer: Self-pay | Admitting: Cardiovascular Disease

## 2018-07-25 ENCOUNTER — Encounter: Payer: Self-pay | Admitting: Physician Assistant

## 2018-07-25 ENCOUNTER — Other Ambulatory Visit: Payer: Self-pay | Admitting: Physician Assistant

## 2018-07-25 DIAGNOSIS — I350 Nonrheumatic aortic (valve) stenosis: Secondary | ICD-10-CM

## 2018-07-30 ENCOUNTER — Other Ambulatory Visit: Payer: Self-pay

## 2018-07-30 ENCOUNTER — Ambulatory Visit (HOSPITAL_COMMUNITY)
Admission: RE | Admit: 2018-07-30 | Discharge: 2018-07-30 | Disposition: A | Discharge status: Home / Self Care | Payer: Medicare PPO | Source: Ambulatory Visit | Attending: Physician Assistant | Admitting: Physician Assistant

## 2018-07-30 ENCOUNTER — Ambulatory Visit (HOSPITAL_COMMUNITY): Admission: RE | Admit: 2018-07-30 | Payer: Medicare PPO | Source: Ambulatory Visit

## 2018-07-30 DIAGNOSIS — I350 Nonrheumatic aortic (valve) stenosis: Secondary | ICD-10-CM | POA: Diagnosis not present

## 2018-07-30 MED ORDER — IOHEXOL 350 MG/ML SOLN
100.0000 mL | Freq: Once | INTRAVENOUS | Status: AC | PRN
  Administered 2018-07-30: 100 mL via INTRAVENOUS

## 2018-08-01 ENCOUNTER — Other Ambulatory Visit: Payer: Self-pay

## 2018-08-01 DIAGNOSIS — I350 Nonrheumatic aortic (valve) stenosis: Secondary | ICD-10-CM

## 2018-08-05 ENCOUNTER — Institutional Professional Consult (permissible substitution) (INDEPENDENT_AMBULATORY_CARE_PROVIDER_SITE_OTHER): Payer: Medicare PPO | Admitting: Thoracic Surgery (Cardiothoracic Vascular Surgery)

## 2018-08-05 ENCOUNTER — Encounter: Payer: Self-pay | Admitting: Thoracic Surgery (Cardiothoracic Vascular Surgery)

## 2018-08-05 ENCOUNTER — Other Ambulatory Visit: Payer: Self-pay

## 2018-08-05 ENCOUNTER — Ambulatory Visit (HOSPITAL_COMMUNITY)
Admission: RE | Admit: 2018-08-05 | Discharge: 2018-08-05 | Disposition: A | Discharge status: Home / Self Care | Payer: Medicare PPO | Source: Ambulatory Visit | Attending: Cardiovascular Disease | Admitting: Cardiovascular Disease

## 2018-08-05 ENCOUNTER — Encounter (HOSPITAL_COMMUNITY): Payer: Self-pay

## 2018-08-05 VITALS — BP 112/72 | HR 69 | Temp 97.7°F | Resp 16 | Ht 75.0 in | Wt 330.0 lb

## 2018-08-05 DIAGNOSIS — Z7409 Other reduced mobility: Secondary | ICD-10-CM | POA: Insufficient documentation

## 2018-08-05 DIAGNOSIS — I350 Nonrheumatic aortic (valve) stenosis: Secondary | ICD-10-CM

## 2018-08-05 DIAGNOSIS — I2510 Atherosclerotic heart disease of native coronary artery without angina pectoris: Secondary | ICD-10-CM | POA: Diagnosis not present

## 2018-08-05 DIAGNOSIS — I5032 Chronic diastolic (congestive) heart failure: Secondary | ICD-10-CM

## 2018-08-05 HISTORY — DX: Other reduced mobility: Z74.09

## 2018-08-05 MED ORDER — IOHEXOL 350 MG/ML SOLN
80.0000 mL | Freq: Once | INTRAVENOUS | Status: AC | PRN
  Administered 2018-08-05: 12:00:00 80 mL via INTRAVENOUS

## 2018-08-05 NOTE — Progress Notes (Signed)
[facility]  MULTIDISCIPLINARY HEART VALVE CLINIC  CARDIOTHORACIC SURGERY CONSULTATION REPORT  Referring Provider is Georgeanna Lea, MD PCP is Elie Goody, NP  Chief Complaint  Patient presents with   Aortic Stenosis    Surgical eval, Cardiac Cath and ECHO 07/22/18/CTA C/A/P 06/29/18/ CTA CARDIAC [facility] 08/05/18...assess for multivessel PCI/TAVR vs CABG /AVR    HPI:  Patient is a 73 year old morbidly obese African-American male with history of aortic stenosis, coronary artery disease, chronic diastolic congestive heart failure, hypertension, hyperlipidemia, borderline type 2 diabetes mellitus, GE reflux disease, degenerative arthritis status post left total knee replacement, peripheral arterial disease status post right below-knee amputation, 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  Patient states that he has been told that he has had a heart murmur for at least 6 or 8 years.  In the past he was followed by a cardiologist in [place].  In 2016 he suffered a couple of falls at which time he fractured his right upper arm and his right lower leg.  Both required surgical treatment.  He developed infection in the right lower leg that failed repeat surgical intervention and antibiotic therapy.  He eventually was transferred to [facility] where he underwent right below-knee amputation.  Prior to amputation he underwent diagnostic cardiac catheterization.  He was told that he had both aortic stenosis and multivessel coronary artery disease but neither were severe enough to merit surgical intervention at that time.  His recovery following amputation was very slow.  He required multiple  revisions of his stump which finally healed 3 years later.  He eventually was fitted with a prosthesis and he walks very short distances using a walker.  For the most part he remains wheelchair-bound.  Approximately 1 year ago the patient moved to [facility] where he now lives with his wife.  He was referred to Dr. Bing Matter first evaluated the patient in March of this year.  Follow-up echocardiogram was ordered and the patient was referred to the multidisciplinary heart valve clinic, but his consultation was delayed because of the COVID-19 pandemic.  Eventually echocardiogram performed July 22, 2018 revealed severe aortic stenosis with preserved left ventricular systolic function.  Left ventricular ejection fraction was estimated > 65%.  Peak velocity across aortic valve measured 4.1 m/s corresponding to mean transvalvular gradient estimated 40 mmHg.  The DVI was reported 0.35 with aortic valve area calculated between 0.91 and 1.09 cm.  Catheterization revealed severe multivessel coronary artery disease with moderate to severe diffuse disease with long segment 70% stenosis of the proximal left anterior descending coronary artery, severe 80% proximal stenosis of the right coronary artery and 80% proximal stenosis of the ramus intermediate branch.  Pulmonary artery pressures were mildly elevated.  Attempts to cross the aortic valve were unsuccessful.  CT angiography was performed and the patient was referred for surgical consultation.  Patient is married and lives locally in [place] with his wife.  He spends most of his time in a wheelchair and he has very limited mobility.  He does not walk very short distances using a walker on his prosthesis for his right leg.  He has chronic lymphedema in his left leg and previous left below-knee amputation.  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  He admits to stable symptoms of exertional shortness of breath that  occur with low-level activity.  Symptoms seem to be gradually getting worse.  He denies resting shortness of breath, PND, or orthopnea.  He denies any history of exertional chest pain or chest tightness.  He has not had dizzy spells or syncope.  He has chronic lower extremity edema in his left lower leg.  Past Medical History:  Diagnosis Date   Anemia 02/02/2015   Chronic diastolic heart failure (HCC) 08/29/2015   Coronary artery disease involving native coronary artery of native heart without angina pectoris    Heart Cath 02/2015 at [facility]     ED (erectile dysfunction) of organic origin    Essential hypertension 02/02/2015   Gastroesophageal reflux disease without esophagitis    History of total left knee replacement    Limited mobility 08/05/2018   Lymphedema of both lower extremities    Morbid obesity (HCC)    Peripheral vascular disease, unspecified (HCC)    Prediabetes    Pure hypercholesterolemia 08/29/2015   Status post below knee amputation of right lower extremity (HCC)    Vitamin D deficiency disease     Past Surgical History:  Procedure Laterality Date   BELOW KNEE LEG AMPUTATION Right 2017   [facility]   ORIF HUMERUS FRACTURE Right 2016   ORIF PROXIMAL TIBIAL PLATEAU FRACTURE Right 2016   RIGHT/LEFT HEART CATH AND CORONARY ANGIOGRAPHY N/A 07/22/2018   Procedure: RIGHT/LEFT HEART CATH AND CORONARY ANGIOGRAPHY;  Surgeon: Tonny Bollman, MD;  Location: [facility] INVASIVE CV LAB;  Service: Cardiovascular;  Laterality: N/A;   TOTAL KNEE ARTHROPLASTY Left     Family History  Problem Relation Age of Onset   Hypertension Mother    Hypertension Father    Heart disease Father    Cancer Father     Social History   Socioeconomic History   Marital status: Married    Spouse name: Not on file   Number of children: Not on file   Years of education: Not on file   Highest education level: Not on file  Occupational History   Not on file  Social Needs    Financial resource strain: Not on file   Food insecurity    Worry: Not on file    Inability: Not on file   Transportation needs    Medical: Not on file    Non-medical: Not on file  Tobacco Use   Smoking status: Never Smoker   Smokeless tobacco: Former Neurosurgeon  Substance and Sexual Activity   Alcohol use: Not Currently   Drug use: Not on file   Sexual activity: Not on file  Lifestyle   Physical activity    Days per week: Not on file    Minutes per session: Not on file   Stress: Not on file  Relationships   Social connections    Talks on phone: Not on file    Gets together: Not on file    Attends religious service: Not on file    Active member of club or organization: Not on file    Attends meetings of clubs or organizations: Not on file    Relationship status: Not on file   Intimate partner violence    Fear of current or ex partner: Not on file    Emotionally abused: Not on file    Physically abused: Not on file    Forced sexual activity: Not on file  Other Topics Concern   Not on file  Social History  Narrative   Not on file    Current Outpatient Medications  Medication Sig Dispense Refill   amLODipine (NORVASC) 10 MG tablet Take 10 mg by mouth daily.     aspirin 81 MG chewable tablet Chew 81 mg by mouth daily.      Cholecalciferol 125 MCG (5000 UT) TABS Take 5,000 Units by mouth daily.     diclofenac (VOLTAREN) 75 MG EC tablet Take 150 mg by mouth daily.      furosemide (LASIX) 40 MG tablet Take 80 mg by mouth daily.      lisinopril (PRINIVIL,ZESTRIL) 40 MG tablet Take 40 mg by mouth daily.     omeprazole (PRILOSEC) 20 MG capsule Take 20 mg by mouth daily.      rosuvastatin (CRESTOR) 20 MG tablet Take 20 mg by mouth daily.      vitamin B-12 (CYANOCOBALAMIN) 1000 MCG tablet Take 2,000 mcg by mouth daily.     No current facility-administered medications for this visit.     No Known Allergies    Review of Systems:   General:  normal appetite,  decreased energy, + weight gain, no weight loss, no fever  Cardiac:  no chest pain with exertion, no chest pain at rest, +SOB with exertion, no resting SOB, no PND, no orthopnea, no palpitations, no arrhythmia, no atrial fibrillation, + LE edema, no dizzy spells, no syncope  Respiratory:  + exertional shortness of breath, no home oxygen, no productive cough, no dry cough, no bronchitis, no wheezing, no hemoptysis, no asthma, no pain with inspiration or cough, no sleep apnea, no CPAP at night  GI:   no difficulty swallowing, + reflux, no frequent heartburn, no hiatal hernia, no abdominal pain, no constipation, no diarrhea, no hematochezia, no hematemesis, no melena  GU:   no dysuria,  no frequency, no urinary tract infection, no hematuria, no enlarged prostate, no kidney stones, no kidney disease  Vascular:  no pain suggestive of claudication, no pain in feet, no leg cramps, no varicose veins, no DVT, no non-healing foot ulcer  Neuro:   no stroke, no TIA's, no seizures, no headaches, no temporary blindness one eye,  no slurred speech, no peripheral neuropathy, + chronic pain, + instability of gait, no memory/cognitive dysfunction  Musculoskeletal: + arthritis, + joint swelling, no myalgias, + difficulty walking, very limited mobility   Skin:   no rash, no itching, no skin infections, no pressure sores or ulcerations  Psych:   no anxiety, no depression, no nervousness, no unusual recent stress  Eyes:   no blurry vision, no floaters, no recent vision changes, no wears glasses or contacts  ENT:   no hearing loss, no loose or painful teeth but one "broken' tooth, no dentures, last saw dentist > 5 years ago  Hematologic:  no easy bruising, no abnormal bleeding, no clotting disorder, no frequent epistaxis  Endocrine:  "borderline" diabetes, does not check CBG's at home           Physical Exam:   BP 112/72 (BP Location: Left Arm, Patient Position: Sitting, Cuff Size: Large)    Pulse 69    Temp 97.7 F  (36.5 C) Comment: THERMAL   Resp 16    Ht  (1.905 m)    Wt (!) 330 lb (149.7 kg)    SpO2 95% Comment: RA   BMI 41.25 kg/m   General:  Morbidly obese AA male NAD   HEENT:  Unremarkable   Neck:   no JVD, no bruits, no adenopathy  Chest:   clear to auscultation, symmetrical breath sounds, no wheezes, no rhonchi   CV:   RRR, grade III/VI crescendo/decrescendo murmur heard best at RSB,  no diastolic murmur  Abdomen:  soft, non-tender, no masses   Extremities:  S/P right BKA, LLE warm, adequately-perfused, pulses not palpable, +  LE edema  Rectal/GU  Deferred  Neuro:   Grossly non-focal and symmetrical throughout  Skin:   Clean and dry, no rashes, no breakdown   Diagnostic Tests:  EKG: NSR w/ 1st degree AV block      ECHOCARDIOGRAM REPORT    Patient Name:   Marvin Jackson Date of Exam: 07/22/2018 Medical Rec #:  [id number]            Height:       75.0 in Accession #:    [id number]           Weight:       335.0 lb Date of Birth:  09/21/1945            BSA:          2.73 m Patient Age:    73 years             BP:           156/75 mmHg Patient Gender: M                    HR:           71 bpm. Exam Location:  Outpatient    Procedure: 2D Echo, Cardiac Doppler and Color Doppler  Indications:    Aortic Stenosis   History:        Patient has no prior history of Echocardiogram examinations.                 Aortic Stenosis, Anemia, Congestive Heart Failure, Coronary                 Artery Diease, Severe Morbid Obesity.   Sonographer:    Belva ChimesWendy Stanford RDCS (AE) Referring Phys: [phone] MICHAEL COOPER    Sonographer Comments: Patient is morbidly obese, Technically difficult study due to poor echo windows and suboptimal apical window. IMPRESSIONS    1. The left ventricle has hyperdynamic systolic function, with an ejection fraction of >65%. The cavity size was normal. Left ventricular diastolic Doppler parameters are consistent with impaired relaxation.  2. The right  ventricle has normal systolic function. The cavity was normal. There is no increase in right ventricular wall thickness.  3. Left atrial size was moderately dilated.  4. Mild thickening of the mitral valve leaflet. Mild calcification of the mitral valve leaflet. There is mild mitral annular calcification present.  5. The tricuspid valve is grossly normal.  6. The aortic valve was not well visualized. Severely thickening of the aortic valve. Severe calcifcation of the aortic valve. Moderate-severe stenosis of the aortic valve.  7. Poor quality images. Calcified but visible opening Overall appears to be tri leaflet with moderate to severe AS.  8. The interatrial septum was not well visualized.  FINDINGS  Left Ventricle: The left ventricle has hyperdynamic systolic function, with an ejection fraction of >65%. The cavity size was normal. There is no increase in left ventricular wall thickness. Left ventricular diastolic Doppler parameters are consistent  with impaired relaxation.  Right Ventricle: The right ventricle has normal systolic function. The cavity was normal. There is no increase in right ventricular wall thickness.  Left Atrium: Left atrial size  was moderately dilated.  Right Atrium: Right atrial size was normal in size. Right atrial pressure is estimated at 10 mmHg.  Interatrial Septum: The interatrial septum was not well visualized.  Pericardium: There is no evidence of pericardial effusion.  Mitral Valve: The mitral valve is normal in structure. Mild thickening of the mitral valve leaflet. Mild calcification of the mitral valve leaflet. There is mild mitral annular calcification present. Mitral valve regurgitation is not visualized by color  flow Doppler.  Tricuspid Valve: The tricuspid valve is grossly normal. Tricuspid valve regurgitation is mild by color flow Doppler.  Aortic Valve: The aortic valve was not well visualized Severely thickening of the aortic valve.  Severe calcifcation of the aortic valve. Aortic valve regurgitation was not visualized by color flow Doppler. There is Moderate-severe stenosis of the aortic  valve, with a calculated valve area of 1.21 cm. Poor quality images. Calcified but visible opening Overall appears to be tri leaflet with moderate to severe AS.  Pulmonic Valve: The pulmonic valve was grossly normal. Pulmonic valve regurgitation is mild by color flow Doppler.    +--------------+--------++  LEFT VENTRICLE                 +----------------+---------++ +--------------+--------++       Diastology                    PLAX 2D                        +----------------+---------++ +--------------+--------++       LV e' lateral:   5.33 cm/s    LVIDd:         3.70 cm         +----------------+---------++ +--------------+--------++       LV E/e' lateral: 10.4         LVIDs:         2.50 cm         +----------------+---------++ +--------------+--------++       LV e' medial:    5.55 cm/s    LV PW:         1.10 cm         +----------------+---------++ +--------------+--------++       LV E/e' medial:  10.0         LV IVS:        1.10 cm         +----------------+---------++ +--------------+--------++  LVOT diam:     2.10 cm    +--------------+--------++  LV SV:         36 ml      +--------------+--------++  LV SV Index:   12.36      +--------------+--------++  LVOT Area:     3.46 cm   +--------------+--------++                            +--------------+--------++   +------------------+---------++  LV Volumes (MOD)               +------------------+---------++  LV area d, A2C:    23.10 cm   +------------------+---------++  LV area d, A4C:    32.80 cm   +------------------+---------++  LV area s, A2C:    6.95 cm    +------------------+---------++  LV area s, A4C:    15.30 cm   +------------------+---------++  LV major d, A2C:   7.26 cm     +------------------+---------++  LV major d, A4C:  7.99 cm      +------------------+---------++  LV major s, A2C:   5.44 cm     +------------------+---------++  LV major s, A4C:   6.51 cm     +------------------+---------++  LV vol d, MOD A2C: 60.9 ml     +------------------+---------++  LV vol d, MOD A4C: 109.0 ml    +------------------+---------++  LV vol s, MOD A2C: 8.1 ml      +------------------+---------++  LV vol s, MOD A4C: 30.5 ml     +------------------+---------++  LV SV MOD A2C:     52.8 ml     +------------------+---------++  LV SV MOD A4C:     109.0 ml    +------------------+---------++  LV SV MOD BP:      68.3 ml     +------------------+---------++  +---------------+---------++  RIGHT VENTRICLE             +---------------+---------++  RV S prime:     9.25 cm/s   +---------------+---------++  +---------------+--------++-----------++  LEFT ATRIUM               Index         +---------------+--------++-----------++  LA diam:        4.40 cm   1.61 cm/m    +---------------+--------++-----------++  LA Vol (A2C):   100.0 ml  36.60 ml/m   +---------------+--------++-----------++  LA Vol (A4C):   121.0 ml  44.29 ml/m   +---------------+--------++-----------++  LA Biplane Vol: 112.0 ml  40.99 ml/m   +---------------+--------++-----------++ +------------+---------++-----------++  RIGHT ATRIUM            Index         +------------+---------++-----------++  RA Area:     18.30 cm                +------------+---------++-----------++  RA Volume:   53.80 ml   19.69 ml/m   +------------+---------++-----------++  +------------------+------------++ +--------------+--------++  AORTIC VALVE                       PULMONIC VALVE            +------------------+------------++ +--------------+--------++  AV Area (Vmax):    1.09 cm        PV Vmax:       0.77 m/s   +------------------+------------++ +--------------+--------++  AV Area (Vmean):   0.91 cm        PV Peak grad:  2.4 mmHg   +------------------+------------++  +--------------+--------++  AV Area (VTI):     1.21 cm       +------------------+------------++  AV Vmax:           406.00 cm/s    +------------------+------------++  AV Vmean:          297.000 cm/s   +------------------+------------++  AV VTI:            0.893 m        +------------------+------------++  AV Peak Grad:      65.9 mmHg      +------------------+------------++  AV Mean Grad:      40.0 mmHg      +------------------+------------++  LVOT Vmax:         128.00 cm/s    +------------------+------------++  LVOT Vmean:        78.200 cm/s    +------------------+------------++  LVOT VTI:          0.313 m        +------------------+------------++  LVOT/AV VTI ratio: 0.35           +------------------+------------++   +-------------+-------++  AORTA                   +-------------+-------++  Ao Root diam: 2.40 cm   +-------------+-------++  +--------------+----------++  MITRAL VALVE                +--------------+-------+ +--------------+----------++  SHUNTS                   MV Area (PHT): 2.60 cm     +--------------+-------+ +--------------+----------++  Systemic VTI:  0.31 m    MV PHT:        84.68 msec   +--------------+-------+ +--------------+----------++  Systemic Diam: 2.10 cm   MV Decel Time: 292 msec     +--------------+-------+ +--------------+----------++ +--------------+----------++  MV E velocity: 55.30 cm/s   +--------------+----------++  MV A velocity: 87.40 cm/s   +--------------+----------++  MV E/A ratio:  0.63         +--------------+----------++    Charlton HawsPeter Nishan MD Electronically signed by Charlton HawsPeter Nishan MD Signature Date/Time: 07/22/2018/1:31:12 PM     RIGHT/LEFT HEART CATH AND CORONARY ANGIOGRAPHY  Conclusion    Ramus lesion is 80% stenosed.  Prox LAD to Mid LAD lesion is 70% stenosed.  Prox RCA lesion is 80% stenosed.  There is severe aortic valve stenosis.   1.  Severe multivessel coronary artery disease with severe stenosis  of the proximal ramus intermedius, moderately severe diffuse proximal LAD stenosis, and severe proximal RCA stenosis 2.  Essentially normal right heart hemodynamics 3.  Severely calcified and restricted aortic valve biplane fluoroscopy, unable to measure transaortic gradients secondary to anatomic issues detailed above with severe subclavian tortuosity and inability to reach the ventricle with standard catheters.  Recommendations: Will obtain a 2D echocardiogram prior to discharge today.  The patient will need surgical consultation for further consideration of treatment options including CABG/aortic valve replacement versus multivessel PCI and TAVR.   Indications  Severe aortic stenosis [I35.0 (ICD-10-CM)]  Procedural Details  Technical Details INDICATION: Severe aortic stenosis.  The patient is referred for right and left heart catheterization for evaluation of severe/critical aortic stenosis based on outside echo study.  He has medical comorbidities of morbid obesity and right below-knee amputation.  PROCEDURAL DETAILS: There was an indwelling IV in a right antecubital vein. Using normal sterile technique, the IV was changed out for a 5 Fr brachial sheath over a 0.018 inch wire. The right wrist was then prepped, draped, and anesthetized with 1% lidocaine. Using the modified Seldinger technique a 5/6 French Slender sheath was placed in the right radial artery. Intra-arterial verapamil was administered through the radial artery sheath. IV heparin was administered after a JR4 catheter was advanced into the central aorta. A Swan-Ganz catheter was used for the right heart catheterization. Standard protocol was followed for recording of right heart pressures and sampling of oxygen saturations. Fick cardiac output was calculated. Standard Judkins catheters were used for selective coronary angiography.  The left heart catheterization procedure was extremely difficult because of severe right subclavian  tortuosity.  I could not reach the coronaries with standard diagnostic catheters.  Guide catheters were utilized and a wire was left in place in order to straighten the subclavian so that the coronaries could be reached for angiography.  I tried to repeat images of the left coronary artery with a 6 [place] guide catheter but could not seat the catheter.  I was able to get enough information from a diagnostic JL 3.5 cm catheter.  For the RCA, catheters would not torque into place.  A 6 [place] guide was used again with the wire in place.  The RCA has a high anterior origin and is imaged subselectively.  The aortic valve was crossed with an AL 2 catheter and straight wire, but the AL to catheter would not advance far enough into the ventricle for a change out, nor would it stay in the ventricle to record pressure.  There were no immediate procedural complications. The patient was transferred to the post catheterization recovery area for further monitoring.    Estimated blood loss <50 mL.   During this procedure medications were administered to achieve and maintain moderate conscious sedation while the patient's heart rate, blood pressure, and oxygen saturation were continuously monitored and I was present face-to-face 100% of this time.  Medications (Filter: Administrations occurring from 07/22/18 0955 to 07/22/18 1102) Medication Rate/Dose/Volume Action  Date Time   fentaNYL (SUBLIMAZE) injection (mcg) 25 mcg Given 07/22/18 1009   Total dose as of 07/22/18 1102 25 mcg Given 1015   75 mcg 25 mcg Given 1043   midazolam (VERSED) injection (mg) 1 mg Given 07/22/18 1009   Total dose as of 07/22/18 1102 0.5 mg Given 1015   2.5 mg 1 mg Given 1043   lidocaine (PF) (XYLOCAINE) 1 % injection (mL) 2 mL Given 07/22/18 1013   Total dose as of 07/22/18 1102 2 mL Given 1015   6 mL 2 mL Given 1015   Radial Cocktail/Verapamil only (mL) 10 mL Given 07/22/18 1016   Total dose as of 07/22/18 1102        10 mL         Heparin (Porcine) in NaCl 1000-0.9 UT/500ML-% SOLN (mL) 500 mL Given 07/22/18 1016   Total dose as of 07/22/18 1102 500 mL Given 1100   1,000 mL        heparin injection (Units) 6,000 Units Given 07/22/18 1028   Total dose as of 07/22/18 1102        6,000 Units        iohexol (OMNIPAQUE) 350 MG/ML injection (mL) 95 mL Given 07/22/18 1055   Total dose as of 07/22/18 1102        95 mL        Sedation Time  Sedation Time Physician-1: 43 minutes 30 seconds  Coronary Findings  Diagnostic Dominance: Right Left Main  Vessel is large. The vessel exhibits minimal luminal irregularities.  Left Anterior Descending  Prox LAD to Mid LAD lesion 70% stenosed  Prox LAD to Mid LAD lesion is 70% stenosed. The lesion is moderately calcified. The proximal to mid LAD has diffuse segmental 60 to 70% stenosis present. The LAD courses to the apex. The first and second diagonal branches are both patent.  Ramus Intermedius  Vessel is large. The ramus intermedius is a large branch with a tight 80% proximal stenosis.  Ramus lesion 80% stenosed  Ramus lesion is 80% stenosed.  Left Circumflex  There is mild diffuse disease throughout the vessel. The circumflex supplies 3 small obtuse marginal branches. The circumflex and OM branches have minor nonobstructive disease.  Right Coronary Artery  Prox RCA lesion 80% stenosed  Prox RCA lesion is 80% stenosed. The lesion is mildly calcified. The RCA has a high anterior origin. The proximal vessel has severe stenosis in the range of 70-75%. The RCA is a dominant vessel.  Intervention  No interventions have been documented. Left Heart  Aortic Valve There is severe aortic valve stenosis. The aortic valve is calcified. There is  restricted aortic valve motion.  Coronary Diagrams  Diagnostic Dominance: Right  Intervention  Implants   No implant documentation for this case.  Syngo Images  Show images for CARDIAC CATHETERIZATION  Images on Long Term  Storage  Show images for [place], [place] to Procedure Log  Procedure Log    Hemo Data   Most Recent Value  Fick Cardiac Output 8.94 L/min  Fick Cardiac Output Index 3.27 (L/min)/BSA  RA A Wave 16 mmHg  RA V Wave 16 mmHg  RA Mean 13 mmHg  RV Systolic Pressure 48 mmHg  RV Diastolic Pressure 1 mmHg  RV EDP 16 mmHg  PA Systolic Pressure 42 mmHg  PA Diastolic Pressure 14 mmHg  PA Mean 26 mmHg  PW A Wave 22 mmHg  PW V Wave 21 mmHg  PW Mean 18 mmHg  AO Systolic Pressure 122 mmHg  AO Diastolic Pressure 75 mmHg  AO Mean 95 mmHg  QP/QS 1  TPVR Index 7.95 HRUI  TSVR Index 29.04 HRUI  PVR SVR Ratio 0.1  TPVR/TSVR Ratio 0.27     Cardiac TAVR CT  TECHNIQUE: The patient was scanned on a Siemens Force 192 slice scanner. A 120 kV retrospective scan was triggered in the descending thoracic aorta at 111 HU's. Gantry rotation speed was 270 msecs and collimation was .9 mm. No beta blockade or nitro were given. The 3D data set was reconstructed in 5% intervals of the R-R cycle. Systolic and diastolic phases were analyzed on a dedicated work station using MPR, MIP and VRT modes. The patient received 80mL OMNIPAQUE IOHEXOL 350 MG/ML SOLN of contrast.  FINDINGS: Aortic Valve: Trileaflet aortic valve with severe leaflet calcifications and thickening, and severely reduced leaflet excursion.  AV calcium score: 2731  Aorta: Mild mixed atherosclerotic plaque in aortic arch. Conventional 3 vessel branch pattern of aortic arch with tortuous arch vessels where visualized proximally.  Sinus of Valsalva Measurements:  Non-coronary: 32 mm  Right - coronary: 29 mm  Left - coronary: 31 mm  Sinotubular Junction: 27 mm  Ascending Thoracic Aorta: 37 mm  Aortic Arch: 28 mm  Descending Thoracic Aorta: 28 mm  Sinus of Valsalva Height:  Left: 16 mm  Right: 17 mm  Coronary Artery Height above Annulus:  Left Main: 12 mm  Right Coronary: 20  mm  Virtual Basal Annulus Measurements: Made at 25% of cardiac cycle due to greater motion artifact at 15 and 20% intervals.  Maximum/Minimum Diameter: 31.7 x 22.6 mm  Perimeter: 86.3 mm  Area: 549 mm2  Based on these measurements, the annulus is best suited to a 29 mm Sapien 3 valve.  Coronary Arteries: Lumens not well visualized on today's study. Coronary calcifications present. High anterior takeoff of RCA, normal origins.  Optimum Fluoroscopic Angle for Delivery: LAO 0, CAU 0  IMPRESSION: 1. Trileaflet aortic valve with severe leaflet calcifications and thickening, and severely reduced leaflet excursion  2. AV calcium score: 2731  3. Normal caliber aorta. Mild mixed atherosclerotic plaque in aortic arch. Conventional 3 vessel branch pattern of aortic arch with tortuous arch vessels where visualized proximally.  4.  Adequate coronary heights, high anterior takeoff of RCA.  5. Virtual annulus measurements - area: 549 mm2. Based on these measurements, the annulus is best suited to a 29 mm Sapien 3 valve.  6.  Optimum Fluoroscopic Angle for Delivery: LAO 0, CAU 0  Poor image quality degrades the diagnostic quality of this study, though improved over initial nondiagnostic scan.   Electronically  Signed   By: Weston Brass   On: 08/05/2018 13:39   CT ANGIOGRAPHY CHEST, ABDOMEN AND PELVIS  TECHNIQUE: Multidetector CT imaging through the chest, abdomen and pelvis was performed using the standard protocol during bolus administration of intravenous contrast. Multiplanar reconstructed images and MIPs were obtained and reviewed to evaluate the vascular anatomy.  CONTRAST:  OMNIPAQUE IOHEXOL 350 MG/ML SOLN  COMPARISON:  No priors.  FINDINGS: CTA CHEST FINDINGS  Cardiovascular: Heart size is borderline enlarged. There is no significant pericardial fluid, thickening or pericardial calcification. There is aortic atherosclerosis, as well  as atherosclerosis of the great vessels of the mediastinum and the coronary arteries, including calcified atherosclerotic plaque in the left main, left anterior descending, left circumflex and right coronary arteries. Severe thickening calcification of the aortic valve.  Mediastinum/Lymph Nodes: No pathologically enlarged mediastinal or hilar lymph nodes. Esophagus is unremarkable in appearance. No axillary lymphadenopathy.  Lungs/Pleura: No suspicious appearing pulmonary nodules or masses. No acute consolidative airspace disease. No pleural effusions. Small left-sided Bochdalek's hernia incidentally noted.  Musculoskeletal/Soft Tissues: There are no aggressive appearing lytic or blastic lesions noted in the visualized portions of the skeleton.  CTA ABDOMEN AND PELVIS FINDINGS  Hepatobiliary: No suspicious cystic or solid hepatic lesions. No intra or extrahepatic biliary ductal dilatation. Gallbladder is normal in appearance.  Pancreas: No pancreatic mass. No pancreatic ductal dilatation. No pancreatic or peripancreatic fluid or inflammatory changes.  Spleen: Unremarkable.  Adrenals/Urinary Tract: Bilateral kidneys and adrenal glands are normal in appearance. No hydroureteronephrosis. Urinary bladder is normal in appearance.  Stomach/Bowel: Normal appearance of the stomach. No pathologic dilatation of small bowel or colon. A few scattered colonic diverticulae are noted, without surrounding inflammatory changes to suggest an acute diverticulitis at this time. The appendix is not confidently identified and may be surgically absent. Regardless, there are no inflammatory changes noted adjacent to the cecum to suggest the presence of an acute appendicitis at this time.  Vascular/Lymphatic: Aortic atherosclerosis, without evidence of aneurysm or dissection in the abdominal or pelvic vasculature. Vascular findings and measurements pertinent to potential TAVR procedure,  as detailed below. No lymphadenopathy noted in the abdomen or pelvis.  Reproductive: Prostate gland and seminal vesicles are unremarkable in appearance.  Other: No significant volume of ascites.  No pneumoperitoneum.  Musculoskeletal: There are no aggressive appearing lytic or blastic lesions noted in the visualized portions of the skeleton.  VASCULAR MEASUREMENTS PERTINENT TO TAVR:  AORTA:  Minimal Aortic Diameter-17 x 17 mm  Severity of Aortic Calcification-mild  RIGHT PELVIS:  Right Common Iliac Artery -  Minimal Diameter-12.2 x 11.2 mm  Tortuosity-mild  Calcification-moderate  Right External Iliac Artery -  Minimal Diameter-11.2 x 10.0 mm  Tortuosity-moderate  Calcification-none  Right Common Femoral Artery -  Minimal Diameter-9.8 x 7.7 mm  Tortuosity-mild  Calcification-moderate  LEFT PELVIS:  Left Common Iliac Artery -  Minimal Diameter-11.4 x 14.0 mm  Tortuosity-mild  Calcification-moderate  Left External Iliac Artery -  Minimal Diameter-10.6 x 9.7 mm  Tortuosity-mild-to-moderate  Calcification-none  Left Common Femoral Artery -  Minimal Diameter-10.9 x 8.2 mm  Tortuosity-mild  Calcification-moderate  Review of the MIP images confirms the above findings.  IMPRESSION: 1. Vascular findings and measurements pertinent to potential TAVR procedure, as detailed above. 2. Severe thickening calcification of the aortic valve, compatible with the reported clinical history of severe aortic stenosis. 3. Aortic atherosclerosis, in addition to left main and 3 vessel coronary artery disease. 4. Colonic diverticulosis without evidence of acute diverticulitis at this time.  5. Additional incidental findings, as above.   Electronically Signed   By: Trudie Reed M.D.   On: 07/30/2018 15:32   STS Risk Calculator  Procedure: AVR + CAB Risk of Mortality: 4.837% Renal Failure: 5.503% Permanent  Stroke: 1.827% Prolonged Ventilation: 20.567% DSW Infection: 0.528% Reoperation: 4.028% Morbidity or Mortality: 29.747% Short Length of Stay: 15.911% Long Length of Stay: 14.553%       Impression:  Patient has stage D severe symptomatic aortic stenosis and multivessel coronary artery disease.  He describes stable but progressive symptoms of exertional shortness of breath and fatigue consistent with chronic diastolic congestive heart failure, New [place] Heart Association functional class IIb-III.  He is not very active at all and is essentially wheelchair-bound, but he gets short of breath with low-level activity.  Denies symptoms of chest pain or chest tightness.    I have personally reviewed the patient's recent transthoracic echocardiogram, diagnostic cardiac catheterization, and CT angiograms.  Echocardiogram demonstrates normal left ventricular systolic function.  There is significant left ventricular hypertrophy with diastolic dysfunction.  The aortic valve is trileaflet.  There is at least moderate thickening and calcification involving all 3 leaflets.  There is moderately reduced leaflet mobility.  Peak velocity across aortic valve measured greater than 4.0 m/s corresponding to mean transvalvular gradient estimated 40 mmHg.  The DVI was notably 0.35 and I suspect the patient does have significant flow across the aortic valve.  Diagnostic cardiac catheterization revealed multivessel coronary artery disease.  There is diffuse long segment 70% stenosis of the proximal left anterior descending coronary artery.  There is high-grade 80% proximal stenosis of the right coronary artery and 80% proximal stenosis of a small to medium size ramus intermediate branch.  The left circumflex system is otherwise free of significant disease.  Attempts to cross the aortic valve were unsuccessful to confirm the presence of severe aortic stenosis.  Right heart pressures were mildly elevated.  I agree the  patient would likely benefit from aortic valve replacement and coronary revascularization.  Based upon coronary anatomy the patient's long-term prognosis would probably be best served with conventional surgical aortic valve replacement and coronary artery bypass grafting.  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  Cardiac-gated CTA of the heart reveals anatomical characteristics consistent with aortic stenosis suitable for treatment by transcatheter aortic valve replacement without any significant complicating features and CTA of the aorta and iliac vessels demonstrate what appears to be adequate pelvic vascular access to facilitate a transfemoral approach.  It is possible that the patient's coronary disease could be treated with PCI and stenting if necessary, although the left anterior descending coronary artery is fairly diffusely diseased.    Plan:  The patient and his wife were counseled at length regarding treatment alternatives for management of severe symptomatic aortic stenosis and multivessel coronary artery disease. Alternative approaches such as conventional aortic valve replacement with coronary artery bypass grafting, transcatheter aortic valve replacement with or without PCI and stenting, and continued medical therapy without intervention were compared and contrasted at length.  The risks associated with conventional surgery were discussed in detail, as were expectations for post-operative convalescence, and why I would be reluctant to consider this patient a candidate for conventional surgery.  Issues specific to  transcatheter aortic valve  replacement were discussed including questions about long term valve durability, the potential for paravalvular leak, possible increased risk of need for permanent pacemaker placement, and other technical complications related to the procedure itself.  Long-term prognosis with medical therapy was discussed. This discussion was placed in the context of the patient's own specific clinical presentation and past medical history.  All of their questions have been addressed.  The patient desires to proceed with transcatheter aortic valve replacement and possible PCI and stenting in the near future.  As a next step the patient will be referred to the dental service clinic for dental examination and cleaning.  He may need dental extraction prior to TAVR.  Following the decision to proceed with transcatheter aortic valve replacement, a discussion has been held regarding what types of management strategies would be attempted intraoperatively in the event of life-threatening complications, including whether or not the patient would be considered a candidate for the use of cardiopulmonary bypass and/or conversion to open sternotomy for attempted surgical intervention.  The patient has been advised of a variety of complications that might develop including but not limited to risks of death, stroke, paravalvular leak, aortic dissection or other major vascular complications, aortic annulus rupture, device embolization, cardiac rupture or perforation, mitral regurgitation, acute myocardial infarction, arrhythmia, heart block or bradycardia requiring permanent pacemaker placement, congestive heart failure, respiratory failure, renal failure, pneumonia, infection, other late complications related to structural valve deterioration or migration, or other complications that might ultimately cause a temporary or permanent loss of functional independence or other long term morbidity.  The patient provides full informed consent for the  procedure as described and all questions were answered.   I spent in excess of 90 minutes during the conduct of this office consultation and >50% of this time involved direct face-to-face encounter with the patient for counseling and/or coordination of their care.     Salvatore Decent. Cornelius Moras, MD 08/05/2018 4:46 PM

## 2018-08-05 NOTE — Patient Instructions (Signed)
Continue all previous medications without any changes at this time  Schedule appointment in the Dental Clinic as soon as practical

## 2018-08-06 ENCOUNTER — Other Ambulatory Visit: Payer: Self-pay

## 2018-08-06 DIAGNOSIS — I350 Nonrheumatic aortic (valve) stenosis: Secondary | ICD-10-CM

## 2018-08-07 ENCOUNTER — Encounter (HOSPITAL_COMMUNITY): Payer: Self-pay | Admitting: Dentistry

## 2018-08-07 ENCOUNTER — Ambulatory Visit (HOSPITAL_COMMUNITY): Payer: Self-pay | Admitting: Dentistry

## 2018-08-07 ENCOUNTER — Other Ambulatory Visit: Payer: Self-pay

## 2018-08-07 VITALS — BP 132/84 | HR 64 | Temp 98.5°F

## 2018-08-07 DIAGNOSIS — K029 Dental caries, unspecified: Secondary | ICD-10-CM

## 2018-08-07 DIAGNOSIS — I2510 Atherosclerotic heart disease of native coronary artery without angina pectoris: Secondary | ICD-10-CM

## 2018-08-07 DIAGNOSIS — K06010 Localized gingival recession, unspecified: Secondary | ICD-10-CM

## 2018-08-07 DIAGNOSIS — I350 Nonrheumatic aortic (valve) stenosis: Secondary | ICD-10-CM

## 2018-08-07 DIAGNOSIS — M278 Other specified diseases of jaws: Secondary | ICD-10-CM

## 2018-08-07 DIAGNOSIS — Z952 Presence of prosthetic heart valve: Secondary | ICD-10-CM

## 2018-08-07 DIAGNOSIS — K045 Chronic apical periodontitis: Secondary | ICD-10-CM

## 2018-08-07 DIAGNOSIS — K0850 Unsatisfactory restoration of tooth, unspecified: Secondary | ICD-10-CM

## 2018-08-07 DIAGNOSIS — Z01818 Encounter for other preprocedural examination: Secondary | ICD-10-CM

## 2018-08-07 DIAGNOSIS — K036 Deposits [accretions] on teeth: Secondary | ICD-10-CM

## 2018-08-07 DIAGNOSIS — K08409 Partial loss of teeth, unspecified cause, unspecified class: Secondary | ICD-10-CM

## 2018-08-07 DIAGNOSIS — K083 Retained dental root: Secondary | ICD-10-CM

## 2018-08-07 DIAGNOSIS — M2632 Excessive spacing of fully erupted teeth: Secondary | ICD-10-CM

## 2018-08-07 DIAGNOSIS — M264 Malocclusion, unspecified: Secondary | ICD-10-CM

## 2018-08-07 DIAGNOSIS — K0530 Chronic periodontitis, unspecified: Secondary | ICD-10-CM

## 2018-08-07 DIAGNOSIS — K030 Excessive attrition of teeth: Secondary | ICD-10-CM

## 2018-08-07 DIAGNOSIS — M2624 Reverse articulation: Secondary | ICD-10-CM

## 2018-08-07 MED ORDER — AMOXICILLIN 500 MG PO CAPS: ORAL_CAPSULE | ORAL | 1 refills | Status: DC

## 2018-08-07 NOTE — Patient Instructions (Signed)
[place]    Department of Dental Medicine     DR. KULINSKI      HEART VALVES AND MOUTH CARE:  FACTS:   If you have any infection in your mouth, it can infect your heart valve.  If you heart valve is infected, you will be seriously ill.  Infections in the mouth can be SILENT and do not always cause pain.  Examples of infections in the mouth are gum disease, dental cavities, and abscesses.  Some possible signs of infection are: Bad breath, bleeding gums, or teeth that are sensitive to sweets, hot, and/or cold. There are many other signs as well.  WHAT YOU HAVE TO DO:   Brush your teeth after meals and at bedtime. Spend at least 2 minutes brushing well, especially behind your back teeth and all around your teeth that stand alone. Brush at the gumline also.  Do not go to bed without brushing your teeth and flossing.  If you gums bleed when you brush or floss, do NOT stop brushing or flossing. It usually means that your gums need more attention and better cleaning.   If your Dentist or Dr. Kulinski gave you a prescription mouthwash to use, make sure to use it as directed. If you run out of the medication, get a refill at the pharmacy.   If you were given any other medications or directions by your Dentist, please follow them. If you did not understand the directions or forget what you were told, please call. We will be happy to refresh her memory.  If you need antibiotics before dental procedures, make sure you take them one hour prior to every dental visit as directed.   Get a dental checkup every 4-6 months in order to keep your mouth healthy, or to find and treat any new infection. You will most likely need your teeth cleaned or gums treated at the same time.  If you are not able to come in for your scheduled appointment, call your Dentist as soon as possible to reschedule.  If you have a problem in between dental visits, call your Dentist.   COVID-19 Education: The  signs and symptoms of COVID-19 were discussed with the patient and how to seek care for testing (follow up with PCP or arrange E-visit).   The importance of social distancing was discussed today.  

## 2018-08-07 NOTE — Progress Notes (Signed)
DENTAL CONSULTATION  Date of Consultation:  08/07/2018 Patient Name:   Marvin Jackson Date of Birth:   [birth year] Medical Record Number: [id number]  COVID 19 SCREENING: The patient does not symptoms concerning for COVID-19 infection (Including fever, chills, cough, or new SHORTNESS OF BREATH).    VITALS: BP 132/84 (BP Location: Left Arm)   Pulse 64   Temp 98.5 F (36.9 C)   CHIEF COMPLAINT: Patient was referred by Dr. Cornelius Moraswen for dental consultation.  HPI: Marvin Jackson is a 73 year old male recently diagnosed with severe aortic stenosis. Patient also has coronary artery disease with possible future PCI and stenting procedures. Patient is now seen as part of a pre-heart valve surgery dental protocol examination to rule out dental infection that may affect the patient was systemic health and anticipated heart valve surgery.  The patient currently denies acute toothaches, swellings, or abscesses. Patient was last seen by dentist [facility] free clinic [facility] [place]. This was approximately 6 years ago. Patient had 2-3 teeth extracted at that time with no complications. Patient denies having partial dentures. Patient denies having dental phobia.  PROBLEM LIST: Patient Active Problem List   Diagnosis Date Noted  . Critical aortic valve stenosis 07/10/2018    Priority: High  . Limited mobility 08/05/2018  . Morbid obesity (HCC)   . 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 Coronary artery disease involving native coronary artery of native heart without angina pectoris   . ED (erectile dysfunction) of organic origin   . Essential hypertension   . Gastroesophageal reflux disease without esophagitis   . History of total left knee replacement   . Lymphedema of both lower extremities   . 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 Nonrheumatic aortic valve stenosis   . Peripheral vascular disease, unspecified (HCC)   . Prediabetes   . Primary  osteoarthritis involving multiple joints   . Status post below knee amputation of right lower extremity (HCC)   . Vitamin D deficiency disease   . 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 Chronic diastolic heart failure (HCC) 08/29/2015  . Hypokalemia 08/29/2015  . Pure hypercholesterolemia 08/29/2015  . Encounter for orthopedic aftercare following surgical amputation 07/28/2015  . 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 Anemia 02/02/2015  . Cellulitis 02/02/2015  . Murmur, cardiac 02/02/2015  . Nonhealing surgical wound, sequela 11/05/2014    PMH: Past Medical History:  Diagnosis Date  . Anemia 02/02/2015  . Chronic diastolic heart failure (HCC) 08/29/2015  . Coronary artery disease involving native coronary artery of native heart without angina pectoris    [facility]    . ED (erectile dysfunction) of organic origin   . Essential hypertension 02/02/2015  . Gastroesophageal reflux disease without esophagitis   . History of total left knee replacement   . Limited mobility 08/05/2018  . Lymphedema of both lower extremities   . Morbid obesity (HCC)   . Peripheral vascular disease, unspecified (HCC)   . Prediabetes   . Pure hypercholesterolemia 08/29/2015  . Status post below knee amputation of right lower extremity (HCC)   . Vitamin D deficiency disease     PSH: Past Surgical History:  Procedure Laterality Date  . BELOW KNEE LEG AMPUTATION Right 2017   [facility]  . ORIF HUMERUS FRACTURE Right 2016  . ORIF PROXIMAL TIBIAL PLATEAU FRACTURE Right 2016  . RIGHT/LEFT HEART CATH AND CORONARY ANGIOGRAPHY N/A 07/22/2018  Procedure: RIGHT/LEFT HEART CATH AND CORONARY ANGIOGRAPHY;  Surgeon: Tonny Bollmanooper, Michael, MD;  [facility];  Service: Cardiovascular;  Laterality: N/A;  . TOTAL KNEE ARTHROPLASTY Left     ALLERGIES: No Known Allergies  MEDICATIONS: Current Outpatient Medications  Medication Sig Dispense Refill  .  amLODipine (NORVASC) 10 MG tablet Take 10 mg by mouth daily.    Marland Kitchen. aspirin 81 MG chewable tablet Chew 81 mg by mouth daily.     . Cholecalciferol 125 MCG (5000 UT) TABS Take 5,000 Units by mouth daily.    . diclofenac (VOLTAREN) 75 MG EC tablet Take 150 mg by mouth daily.     . furosemide (LASIX) 40 MG tablet Take 80 mg by mouth daily.     Marland Kitchen. lisinopril (PRINIVIL,ZESTRIL) 40 MG tablet Take 40 mg by mouth daily.    Marland Kitchen. omeprazole (PRILOSEC) 20 MG capsule Take 20 mg by mouth daily.     . rosuvastatin (CRESTOR) 20 MG tablet Take 20 mg by mouth daily.     . vitamin B-12 (CYANOCOBALAMIN) 1000 MCG tablet Take 2,000 mcg by mouth daily.     No current facility-administered medications for this visit.     LABS: Lab Results  Component Value Date   HGB 12.2 (L) 07/22/2018   HCT 36.0 (L) 07/22/2018      Component Value Date/Time   NA 142 07/22/2018 1022   K 4.0 07/22/2018 1022   No results found for: INR, PROTIME No results found for: PTT  SOCIAL HISTORY: Social History   Socioeconomic History  . Marital status: Married    Spouse name: Not on file  . Number of children: Not on file  . Years of education: Not on file  . Highest education level: Not on file  Occupational History  . Not on file  Social Needs  . Financial resource strain: Not on file  . Food insecurity    Worry: Not on file    Inability: Not on file  . Transportation needs    Medical: Not on file    Non-medical: Not on file  Tobacco Use  . Smoking status: Never Smoker  . Smokeless tobacco: Former Engineer, waterUser  Substance and Sexual Activity  . Alcohol use: Not Currently  . Drug use: Not on file  . Sexual activity: Not on file  Lifestyle  . Physical activity    Days per week: Not on file    Minutes per session: Not on file  . Stress: Not on file  Relationships  . Social Musicianconnections    Talks on phone: Not on file    Gets together: Not on file    Attends religious service: Not on file    Active member of club or  organization: Not on file    Attends meetings of clubs or organizations: Not on file    Relationship status: Not on file  . Intimate partner violence    Fear of current or ex partner: Not on file    Emotionally abused: Not on file    Physically abused: Not on file    Forced sexual activity: Not on file  Other Topics Concern  . Not on file  Social History Narrative  . Not on file    FAMILY HISTORY: Family History  Problem Relation Age of Onset  . Hypertension Mother   . Hypertension Father   . Heart disease Father   . Cancer Father     REVIEW OF SYSTEMS: Reviewed with the patient as per History  of present illness. Psych: Patient denies having dental phobia.  DENTAL HISTORY: CHIEF COMPLAINT: Patient was referred by Dr. Roxy Manns for dental consultation.  HPI: Marvin Jackson is a 73 year old male recently diagnosed with severe aortic stenosis. Patient also has coronary artery disease with possible future PCI and stenting procedures. Patient is now seen as part of a pre-heart valve surgery dental protocol examination to rule out dental infection that may affect the patient was systemic health and anticipated heart valve surgery.  The patient currently denies acute toothaches, swellings, or abscesses. Patient was last seen by dentist at the free clinic in [facility]. This was approximately 6 years ago. Patient had 2-3 teeth extracted at that time with no complications. Patient denies having partial dentures. Patient denies having dental phobia.   DENTAL EXAMINATION: GENERAL:  The patient is an obese male that presents in a wheelchair in no acute distress. Patient did not wear his right leg prosthesis today. HEAD AND NECK:   There is no palpable neck lymphadenopathy. The patient denies acute TMJ symptoms. INTRAORAL EXAM: The patient has incipient xerostomia. There is no evidence of oral abscess formation.  The patient has maxillary right and maxillary left buccal  exostoses in the area of tooth numbers 1 through 3 and 14 through 16. DENTITION:  Patient is missing tooth numbers 1-3, 14-16, 17-20, and 30-32.  Patient has a retained root segment in the area #4.  The patient may have a retained root tip in the area of #19 but there is no evidence clinically of the retained root tip. Multiple diastemas are noted.t The patient has maxillary and mandibular interincisal and occlusal attrition. PERIODONTAL:  The patient has chronic periodontitis with plaque and calculus accumulations, gingival recession, and incipient to moderate bone loss.  No significant tooth mobility is noted. DENTAL CARIES/SUBOPTIMAL RESTORATIONS:  Multiple dental caries and suboptimal dental restorations are noted. ENDODONTIC:  The patient currently denies acute pulpitis symptoms. The patient has periapical pathology and radiolucency at the apex of tooth #4. CROWN AND BRIDGE: There are no crown or bridge restorations. PROSTHODONTIC: The patient denies having partial dentures. OCCLUSION: The patient has a poor occlusal scheme secondary to multiple missing teeth, retained root segment, multiple diastemas, anterior and posterior crossbite, and lack of replacement of missing teeth with dental prostheses.  RADIOGRAPHIC INTERPRETATION: No orthopantogram was able to be obtained today secondary to patient's inability to stand for the orthopantogram.  The patient's wheelchair would also not accommodate to the orthopantogram. A full series of dental radiographs were obtained. There are multiple missing teeth. There are retained root segments. There is moderate bone loss. Multiple diastemas are noted.There is evidence of excessive maxillary and mandibular interincisal and occlusal attrition. There is a periapical radiolucency at the apex of tooth #4.  Multiple dental caries are noted. Multiple suboptimal dental restorations are noted.  There appears to be a retained root tip in the area of #19 near the level of  the crestal bone.   ASSESSMENTS: 1. Severe aortic stenosis 2. Coronary artery disease 3. Pre-heart valve surgery dental protocol 4. Chronic apical periodontitis 5. Retained root segments 6. Dental caries 7. Suboptimal dental restorations 8. Maxillary and mandibular excessive incisal and occlusal attrition 9. Multiple missing teeth 10. Anterior and posterior crossbite 11. Multiple diastemas 12. Supra-eruption and drifting of the unopposed teeth into the edentulous areas 13. Poor occlusal scheme and malocclusion 14. Maxillary left and right buccal exostoses in the area of tooth numbers 1 through 3 and 14 through 16. 15.  Questionable need for antibiotic premedication prior to invasive dental procedures due to the previous total knee replacement 14. Questionable need for antibiotic premedication prior to invasive dental procedures due to the nearness to the anticipated aortic valve replacement.   PLAN/RECOMMENDATIONS: 1. I discussed the risks, benefits, and complications of various treatment options with the patient in relationship to his medical and dental conditions, anticipated aortic valve replacement, and risk for endocarditis. We discussed various treatment options to include no treatment, multiple extractions with alveoloplasty, pre-prosthetic surgery as indicated, periodontal therapy, dental restorations, root canal therapy, crown and bridge therapy, implant therapy, and replacement of missing teeth as indicated. We also discussed referral to an oral surgeon for extraction procedures and a new primary dentist for future periodontal therapy and dental restorations. The patient currently wishes to proceed with referral to Dr. Dutch Quintodd Owsley, oral surgeon, for evaluation and extraction of tooth #4.  This has been scheduled for Thursday, 08/08/2018 at 3:15 PM.  [facility] [place] [facility] if Dr. Chales Salmonwsley feels that antibiotic  premedication is needed.The patient will then proceed with future PCI and stenting as well as TAVR procedure after adequate healing from the dental extractions. The patient will then follow-up with a new primary [place] for periodontal therapy dental restorations once medically stable from the anticipated heart valve surgery in 3-6 months.the patient will require antibiotic premedication prior to invasive dental procedures after the heart valve surgery as per American Heart Association guidelines.   2. Discussion of findings with medical team and coordination of future medical and dental care as needed.  I spent in excess of  135 minutes during the conduct of this consultation and >50% of this time involved direct face-to-face encounter for counseling and/or coordination of the patient's care.    Charlynne Panderonald F. Lautaro Koral, DDS

## 2018-08-08 ENCOUNTER — Encounter: Payer: Self-pay | Admitting: Thoracic Surgery (Cardiothoracic Vascular Surgery)

## 2018-08-08 ENCOUNTER — Ambulatory Visit: Payer: Medicare PPO | Admitting: Cardiology

## 2018-08-20 ENCOUNTER — Encounter: Payer: Self-pay | Admitting: Physician Assistant

## 2018-08-20 ENCOUNTER — Other Ambulatory Visit: Payer: Self-pay | Admitting: Physician Assistant

## 2018-08-20 DIAGNOSIS — I350 Nonrheumatic aortic (valve) stenosis: Secondary | ICD-10-CM

## 2018-08-26 ENCOUNTER — Other Ambulatory Visit: Payer: Self-pay

## 2018-08-26 DIAGNOSIS — I350 Nonrheumatic aortic (valve) stenosis: Secondary | ICD-10-CM

## 2018-08-28 ENCOUNTER — Other Ambulatory Visit: Payer: Self-pay

## 2018-08-28 ENCOUNTER — Ambulatory Visit (HOSPITAL_COMMUNITY)
Admission: RE | Admit: 2018-08-28 | Discharge: 2018-08-28 | Disposition: A | Discharge status: Home / Self Care | Payer: Medicare PPO | Source: Ambulatory Visit | Attending: Physician Assistant | Admitting: Physician Assistant

## 2018-08-28 ENCOUNTER — Ambulatory Visit: Payer: Medicare PPO | Admitting: Physical Therapy

## 2018-08-28 DIAGNOSIS — I350 Nonrheumatic aortic (valve) stenosis: Secondary | ICD-10-CM

## 2018-08-29 NOTE — Progress Notes (Signed)
[id number] [place], [place] - [address]  [address] Phone: [phone] Fax: [phone]    Your procedure is scheduled on Tuesday, July 28th  Report to [facility] Main Entrance "A" at 7:15 A.M., and check in at the Admitting office.  Call this number if you have problems the morning of surgery:  [phone]  Call [phone] if you have any questions prior to your surgery date Monday-Friday 8am-4pm   Remember:  Do not eat or drink after midnight the night before your surgery   Take these medicines the morning of surgery with A SIP OF WATER: NONE.  7 days prior to surgery STOP taking Aleve, Naproxen, Ibuprofen, Motrin, Advil, Goody's, BC's, all herbal medications, fish oil, and all vitamins.   The Morning of Surgery  Do not wear jewelry, make-up or nail polish.  Do not wear lotions, powders, or perfumes/colognes, or deodorant  Do not shave 48 hours prior to surgery.  Men may shave face and neck.  Do not bring valuables to the hospital.  [facility] is not responsible for any belongings or valuables.  If you are a smoker, DO NOT Smoke 24 hours prior to surgery IF you wear a CPAP at night please bring your mask, tubing, and machine the morning of surgery   Remember that you must have someone to transport you home after your surgery, and remain with you for 24 hours if you are discharged the same day.  Contacts, glasses, hearing aids, dentures or bridgework may not be worn into surgery.   Leave your suitcase in the car.  After surgery it may be brought to your room.  For patients admitted to the hospital, discharge time will be determined by your treatment team.  Patients discharged the day of surgery will not be allowed to drive home.   Special instructions:   [place]- Preparing For Surgery  Before surgery, you can play an important role. Because skin is not sterile, your skin needs to be as free of germs as possible. You  can reduce the number of germs on your skin by washing with CHG (chlorahexidine gluconate) Soap before surgery.  CHG is an antiseptic cleaner which kills germs and bonds with the skin to continue killing germs even after washing.    Oral Hygiene is also important to reduce your risk of infection.  Remember - BRUSH YOUR TEETH THE MORNING OF SURGERY WITH YOUR REGULAR TOOTHPASTE  Please do not use if you have an allergy to CHG or antibacterial soaps. If your skin becomes reddened/irritated stop using the CHG.  Do not shave (including legs and underarms) for at least 48 hours prior to first CHG shower. It is OK to shave your face.  Please follow these instructions carefully.   1. Shower the NIGHT BEFORE SURGERY and the MORNING OF SURGERY with CHG Soap.   2. If you chose to wash your hair, wash your hair first as usual with your normal shampoo.  3. After you shampoo, rinse your hair and body thoroughly to remove the shampoo.  4. Use CHG as you would any other liquid soap. You can apply CHG directly to the skin and wash gently with a scrungie or a clean washcloth.   5. Apply the CHG Soap to your body ONLY FROM THE NECK DOWN.  Do not use on open wounds or open sores. Avoid contact with your eyes, ears, mouth and genitals (private parts). Wash Face and genitals (private parts)  with your normal soap.   6. Wash thoroughly, paying special attention to the area where your surgery will be performed.  7. Thoroughly rinse your body with warm water from the neck down.  8. DO NOT shower/wash with your normal soap after using and rinsing off the CHG Soap.  9. Pat yourself dry with a CLEAN TOWEL.  10. Wear CLEAN PAJAMAS to bed the night before surgery, wear comfortable clothes the morning of surgery  11. Place CLEAN SHEETS on your bed the night of your first shower and DO NOT SLEEP WITH PETS.  Day of Surgery:  Do not apply any deodorants/lotions. Please shower the morning of surgery with the CHG soap   Please wear clean clothes to the hospital/surgery center.   Remember to brush your teeth WITH YOUR REGULAR TOOTHPASTE.  Please read over the following fact sheets that you were given.

## 2018-08-30 ENCOUNTER — Encounter (HOSPITAL_COMMUNITY)
Admission: RE | Admit: 2018-08-30 | Discharge: 2018-08-30 | Disposition: A | Discharge status: Home / Self Care | Payer: Medicare PPO | Source: Ambulatory Visit | Attending: Cardiovascular Disease | Admitting: Cardiovascular Disease

## 2018-08-30 ENCOUNTER — Other Ambulatory Visit: Payer: Self-pay

## 2018-08-30 ENCOUNTER — Encounter (HOSPITAL_COMMUNITY): Payer: Self-pay

## 2018-08-30 ENCOUNTER — Ambulatory Visit (HOSPITAL_COMMUNITY)
Admission: RE | Admit: 2018-08-30 | Discharge: 2018-08-30 | Disposition: A | Discharge status: Home / Self Care | Payer: Medicare PPO | Source: Ambulatory Visit | Attending: Cardiovascular Disease | Admitting: Cardiovascular Disease

## 2018-08-30 ENCOUNTER — Other Ambulatory Visit (HOSPITAL_COMMUNITY)
Admission: RE | Admit: 2018-08-30 | Discharge: 2018-08-30 | Disposition: A | Discharge status: Home / Self Care | Payer: Medicare PPO | Source: Ambulatory Visit | Attending: Cardiovascular Disease | Admitting: Cardiovascular Disease

## 2018-08-30 DIAGNOSIS — R001 Bradycardia, unspecified: Secondary | ICD-10-CM | POA: Insufficient documentation

## 2018-08-30 DIAGNOSIS — I350 Nonrheumatic aortic (valve) stenosis: Secondary | ICD-10-CM | POA: Insufficient documentation

## 2018-08-30 DIAGNOSIS — Z01818 Encounter for other preprocedural examination: Secondary | ICD-10-CM | POA: Insufficient documentation

## 2018-08-30 DIAGNOSIS — R9431 Abnormal electrocardiogram [ECG] [EKG]: Secondary | ICD-10-CM | POA: Diagnosis not present

## 2018-08-30 DIAGNOSIS — I517 Cardiomegaly: Secondary | ICD-10-CM | POA: Diagnosis not present

## 2018-08-30 DIAGNOSIS — Z1159 Encounter for screening for other viral diseases: Secondary | ICD-10-CM | POA: Diagnosis not present

## 2018-08-30 DIAGNOSIS — I440 Atrioventricular block, first degree: Secondary | ICD-10-CM | POA: Insufficient documentation

## 2018-08-30 HISTORY — DX: Unilateral inguinal hernia, without obstruction or gangrene, not specified as recurrent: K40.90

## 2018-08-30 HISTORY — DX: Unspecified osteoarthritis, unspecified site: M19.90

## 2018-08-30 LAB — COMPREHENSIVE METABOLIC PANEL
ALT: 13 U/L (ref 0–44)
AST: 14 U/L — ABNORMAL LOW (ref 15–41)
Albumin: 3.9 g/dL (ref 3.5–5.0)
Alkaline Phosphatase: 88 U/L (ref 38–126)
Anion gap: 10 (ref 5–15)
BUN: 11 mg/dL (ref 8–23)
CO2: 22 mmol/L (ref 22–32)
Calcium: 9.1 mg/dL (ref 8.9–10.3)
Chloride: 108 mmol/L (ref 98–111)
Creatinine, Ser: 0.73 mg/dL (ref 0.61–1.24)
GFR calc Af Amer: >60 mL/min (ref >60)
GFR calc non Af Amer: >60 mL/min (ref >60)
Glucose, Bld: 99 mg/dL (ref 70–99)
Potassium: 4 mmol/L (ref 3.5–5.1)
Sodium: 140 mmol/L (ref 135–145)
Total Bilirubin: 0.4 mg/dL (ref 0.3–1.2)
Total Protein: 7.3 g/dL (ref 6.5–8.1)

## 2018-08-30 LAB — BLOOD GAS, ARTERIAL
Acid-Base Excess: 1.7 mmol/L (ref 0.0–2.0)
Bicarbonate: 25.8 mmol/L (ref 20.0–28.0)
Drawn by: 42180
O2 Saturation: 94.8 %
Patient temperature: 98.6
pCO2 arterial: 41.1 mmHg (ref 32.0–48.0)
pH, Arterial: 7.414 (ref 7.350–7.450)
pO2, Arterial: 88.9 mmHg (ref 83.0–108.0)

## 2018-08-30 LAB — TYPE AND SCREEN
ABO/RH(D): A POS
Antibody Screen: NEGATIVE

## 2018-08-30 LAB — BRAIN NATRIURETIC PEPTIDE: B Natriuretic Peptide: 113.9 pg/mL — ABNORMAL HIGH (ref 0.0–100.0)

## 2018-08-30 LAB — SURGICAL PCR SCREEN
MRSA, PCR: NEGATIVE
Staphylococcus aureus: NEGATIVE

## 2018-08-30 LAB — APTT: aPTT: 31 seconds (ref 24–36)

## 2018-08-30 LAB — PROTIME-INR
INR: 1.1 (ref 0.8–1.2)
Prothrombin Time: 14.1 seconds (ref 11.4–15.2)

## 2018-08-30 LAB — CBC
HCT: 37.6 % — ABNORMAL LOW (ref 39.0–52.0)
Hemoglobin: 11 g/dL — ABNORMAL LOW (ref 13.0–17.0)
MCH: 23.1 pg — ABNORMAL LOW (ref 26.0–34.0)
MCHC: 29.3 g/dL — ABNORMAL LOW (ref 30.0–36.0)
MCV: 78.8 fL — ABNORMAL LOW (ref 80.0–100.0)
Platelets: 276 10*3/uL (ref 150–400)
RBC: 4.77 MIL/uL (ref 4.22–5.81)
RDW: 18.7 % — ABNORMAL HIGH (ref 11.5–15.5)
WBC: 9.4 10*3/uL (ref 4.0–10.5)
nRBC: 0 % (ref 0.0–0.2)

## 2018-08-30 LAB — URINALYSIS, ROUTINE W REFLEX MICROSCOPIC
Bilirubin Urine: NEGATIVE
Glucose, UA: NEGATIVE mg/dL
Hgb urine dipstick: NEGATIVE
Ketones, ur: NEGATIVE mg/dL
Leukocytes,Ua: NEGATIVE
Nitrite: NEGATIVE
Protein, ur: NEGATIVE mg/dL
Specific Gravity, Urine: 1.024 (ref 1.005–1.030)
pH: 5 (ref 5.0–8.0)

## 2018-08-30 LAB — HEMOGLOBIN A1C
Hgb A1c MFr Bld: 6 % — ABNORMAL HIGH (ref 4.8–5.6)
Mean Plasma Glucose: 125.5 mg/dL

## 2018-08-30 LAB — ABO/RH: ABO/RH(D): A POS

## 2018-08-30 NOTE — Progress Notes (Signed)
Lauren, RN made aware of abnormal labs, via [employer].

## 2018-08-30 NOTE — Progress Notes (Addendum)
PCP - [name], NP Cardiologist - Dr. Jenne Campus  Chest x-ray - 08/30/2018 EKG - 08/30/2018 Stress Test - per patient "20+ years ago" ECHO - 07/22/2018 Cardiac Cath - 07/22/2018  Sleep Study - denies CPAP - N/A  Blood Thinner Instructions: N/A Aspirin Instructions: Continue taking without change through the day before surgery  Anesthesia review: YES, cardiac history  Coronavirus Screening  Have you experienced the following symptoms:  Cough yes/no: No Fever (>100.110F)  yes/no: No Runny nose yes/no: No Sore throat yes/no: No Difficulty breathing/shortness of breath  yes/no: No  Have you or a family member traveled in the last 14 days and where? yes/no: No  If the patient indicates "YES" to the above questions, their PAT will be rescheduled to limit the exposure to others and, the surgeon will be notified. THE PATIENT WILL NEED TO BE ASYMPTOMATIC FOR 14 DAYS.   If the patient is not experiencing any of these symptoms, the PAT nurse will instruct them to NOT bring anyone with them to their appointment since they may have these symptoms or traveled as well.   Please remind your patients and families that hospital visitation restrictions are in effect and the importance of the restrictions.   Patient denies shortness of breath, fever, cough and chest pain at PAT appointment  Patient verbalized understanding of instructions that were given to them at the PAT appointment. Patient was also instructed that they will need to review over the PAT instructions again at home before surgery.

## 2018-08-30 NOTE — Progress Notes (Signed)
   08/30/18 1127  OBSTRUCTIVE SLEEP APNEA  Have you ever been diagnosed with sleep apnea through a sleep study? No  Do you snore loudly (loud enough to be heard through closed doors)?  0  Do you often feel tired, fatigued, or sleepy during the daytime (such as falling asleep during driving or talking to someone)? 0  Has anyone observed you stop breathing during your sleep? 0  Do you have, or are you being treated for high blood pressure? 1  BMI more than 35 kg/m2? 1  Age > 50 (1-yes) 1  Neck circumference greater than:Male 16 inches or larger, Male 17inches or larger? 1  Male Gender (Yes=1) 1  Obstructive Sleep Apnea Score 5

## 2018-08-31 LAB — SARS CORONAVIRUS 2 (TAT 6-24 HRS): SARS Coronavirus 2: NEGATIVE

## 2018-09-02 MED ORDER — NOREPINEPHRINE 4 MG/250ML-% IV SOLN
0.0000 ug/min | INTRAVENOUS | Status: DC
  Filled 2018-09-02: qty 250

## 2018-09-02 MED ORDER — VANCOMYCIN HCL 10 G IV SOLR
1500.0000 mg | INTRAVENOUS | Status: AC
  Administered 2018-09-03: 1500 mg via INTRAVENOUS
  Filled 2018-09-02 (×2): qty 1500

## 2018-09-02 MED ORDER — SODIUM CHLORIDE 0.9 % IV SOLN
1.5000 g | INTRAVENOUS | Status: AC
  Administered 2018-09-03: 1.5 g via INTRAVENOUS
  Filled 2018-09-02 (×2): qty 1.5

## 2018-09-02 MED ORDER — SODIUM CHLORIDE 0.9 % IV SOLN
INTRAVENOUS | Status: DC
  Filled 2018-09-02 (×2): qty 30

## 2018-09-02 MED ORDER — MAGNESIUM SULFATE 50 % IJ SOLN
40.0000 meq | INTRAMUSCULAR | Status: DC
  Filled 2018-09-02: qty 9.85

## 2018-09-02 MED ORDER — DEXMEDETOMIDINE HCL IN NACL 400 MCG/100ML IV SOLN
0.1000 ug/kg/h | INTRAVENOUS | Status: DC
  Filled 2018-09-02 (×2): qty 100

## 2018-09-02 MED ORDER — POTASSIUM CHLORIDE 2 MEQ/ML IV SOLN
80.0000 meq | INTRAVENOUS | Status: DC
  Filled 2018-09-02: qty 40

## 2018-09-02 NOTE — Anesthesia Preprocedure Evaluation (Addendum)
Anesthesia Evaluation  Patient identified by MRN, date of birth, ID band Patient awake    Reviewed: Allergy & Precautions, NPO status , Patient's Chart, lab work & pertinent test results  History of Anesthesia Complications (+) PONV and history of anesthetic complications  Airway Mallampati: II  TM Distance: >3 FB     Dental no notable dental hx. (+) Dental Advisory Given   Pulmonary neg pulmonary ROS,    Pulmonary exam normal        Cardiovascular hypertension, Pt. on medications + CAD, + Peripheral Vascular Disease and +CHF  Normal cardiovascular exam + Systolic murmurs 1.  Severe multivessel coronary artery disease with severe stenosis of the proximal ramus intermedius, moderately severe diffuse proximal LAD stenosis, and severe proximal RCA stenosis 2.  Essentially normal right heart hemodynamics 3.  Severely calcified and restricted aortic valve biplane fluoroscopy, unable to measure transaortic gradients secondary to anatomic issues detailed above with severe subclavian tortuosity and inability to reach the ventricle with standard catheters.   Neuro/Psych negative neurological ROS     GI/Hepatic Neg liver ROS, GERD  ,  Endo/Other  Morbid obesity  Renal/GU negative Renal ROS     Musculoskeletal negative musculoskeletal ROS (+)   Abdominal   Peds  Hematology negative hematology ROS (+)   Anesthesia Other Findings Day of surgery medications reviewed with the patient.  Reproductive/Obstetrics                            Anesthesia Physical Anesthesia Plan  ASA: IV  Anesthesia Plan: MAC   Post-op Pain Management:    Induction:   PONV Risk Score and Plan: Ondansetron and Propofol infusion  Airway Management Planned: Natural Airway  Additional Equipment:   Intra-op Plan:   Post-operative Plan:   Informed Consent: I have reviewed the patients History and Physical, chart, labs  and discussed the procedure including the risks, benefits and alternatives for the proposed anesthesia with the patient or authorized representative who has indicated his/her understanding and acceptance.     Dental advisory given  Plan Discussed with: CRNA, Anesthesiologist and Surgeon  Anesthesia Plan Comments:        Anesthesia Quick Evaluation

## 2018-09-03 ENCOUNTER — Encounter (HOSPITAL_COMMUNITY): Payer: Self-pay | Admitting: Physician Assistant

## 2018-09-03 ENCOUNTER — Other Ambulatory Visit: Payer: Self-pay | Admitting: Physician Assistant

## 2018-09-03 ENCOUNTER — Inpatient Hospital Stay (HOSPITAL_COMMUNITY)
Admission: RE | Admit: 2018-09-03 | Discharge: 2018-09-04 | DRG: 266 | Disposition: A | Discharge status: Home / Self Care | Payer: Medicare PPO | Attending: Cardiovascular Disease | Admitting: Cardiovascular Disease

## 2018-09-03 ENCOUNTER — Inpatient Hospital Stay (HOSPITAL_COMMUNITY): Payer: Medicare PPO

## 2018-09-03 ENCOUNTER — Inpatient Hospital Stay (HOSPITAL_COMMUNITY): Payer: Medicare PPO | Admitting: Physician Assistant

## 2018-09-03 ENCOUNTER — Encounter (HOSPITAL_COMMUNITY)
Admission: RE | Disposition: A | Discharge status: Home / Self Care | Payer: Medicare PPO | Source: Home / Self Care | Attending: Cardiovascular Disease

## 2018-09-03 DIAGNOSIS — E7800 Pure hypercholesterolemia, unspecified: Secondary | ICD-10-CM | POA: Diagnosis present

## 2018-09-03 DIAGNOSIS — I739 Peripheral vascular disease, unspecified: Secondary | ICD-10-CM | POA: Diagnosis present

## 2018-09-03 DIAGNOSIS — K219 Gastro-esophageal reflux disease without esophagitis: Secondary | ICD-10-CM | POA: Diagnosis not present

## 2018-09-03 DIAGNOSIS — E559 Vitamin D deficiency, unspecified: Secondary | ICD-10-CM | POA: Diagnosis present

## 2018-09-03 DIAGNOSIS — Z006 Encounter for examination for normal comparison and control in clinical research program: Secondary | ICD-10-CM

## 2018-09-03 DIAGNOSIS — I350 Nonrheumatic aortic (valve) stenosis: Secondary | ICD-10-CM

## 2018-09-03 DIAGNOSIS — I2510 Atherosclerotic heart disease of native coronary artery without angina pectoris: Secondary | ICD-10-CM | POA: Diagnosis present

## 2018-09-03 DIAGNOSIS — I110 Hypertensive heart disease with heart failure: Secondary | ICD-10-CM | POA: Diagnosis not present

## 2018-09-03 DIAGNOSIS — I5033 Acute on chronic diastolic (congestive) heart failure: Secondary | ICD-10-CM | POA: Diagnosis present

## 2018-09-03 DIAGNOSIS — I872 Venous insufficiency (chronic) (peripheral): Secondary | ICD-10-CM | POA: Diagnosis present

## 2018-09-03 DIAGNOSIS — Z96652 Presence of left artificial knee joint: Secondary | ICD-10-CM | POA: Diagnosis not present

## 2018-09-03 DIAGNOSIS — Z6841 Body Mass Index (BMI) 40.0 and over, adult: Secondary | ICD-10-CM

## 2018-09-03 DIAGNOSIS — K5730 Diverticulosis of large intestine without perforation or abscess without bleeding: Secondary | ICD-10-CM | POA: Diagnosis present

## 2018-09-03 DIAGNOSIS — I700 Atherosclerosis of aorta: Secondary | ICD-10-CM | POA: Diagnosis present

## 2018-09-03 DIAGNOSIS — Z952 Presence of prosthetic heart valve: Secondary | ICD-10-CM

## 2018-09-03 DIAGNOSIS — Z809 Family history of malignant neoplasm, unspecified: Secondary | ICD-10-CM

## 2018-09-03 DIAGNOSIS — Z7982 Long term (current) use of aspirin: Secondary | ICD-10-CM

## 2018-09-03 DIAGNOSIS — Z8249 Family history of ischemic heart disease and other diseases of the circulatory system: Secondary | ICD-10-CM | POA: Diagnosis not present

## 2018-09-03 DIAGNOSIS — E1151 Type 2 diabetes mellitus with diabetic peripheral angiopathy without gangrene: Secondary | ICD-10-CM | POA: Diagnosis present

## 2018-09-03 DIAGNOSIS — I10 Essential (primary) hypertension: Secondary | ICD-10-CM | POA: Diagnosis present

## 2018-09-03 DIAGNOSIS — I440 Atrioventricular block, first degree: Secondary | ICD-10-CM | POA: Diagnosis present

## 2018-09-03 DIAGNOSIS — Z89511 Acquired absence of right leg below knee: Secondary | ICD-10-CM | POA: Diagnosis not present

## 2018-09-03 DIAGNOSIS — M1990 Unspecified osteoarthritis, unspecified site: Secondary | ICD-10-CM | POA: Diagnosis not present

## 2018-09-03 HISTORY — DX: Presence of prosthetic heart valve: Z95.2

## 2018-09-03 HISTORY — DX: Nonrheumatic aortic (valve) stenosis: I35.0

## 2018-09-03 HISTORY — PX: TRANSCATHETER AORTIC VALVE REPLACEMENT, TRANSFEMORAL: SHX6400

## 2018-09-03 HISTORY — PX: TEE WITHOUT CARDIOVERSION: SHX5443

## 2018-09-03 LAB — POCT I-STAT, CHEM 8
BUN: 16 mg/dL (ref 8–23)
Calcium, Ion: 1.23 mmol/L (ref 1.15–1.40)
Chloride: 105 mmol/L (ref 98–111)
Creatinine, Ser: 0.7 mg/dL (ref 0.61–1.24)
Glucose, Bld: 126 mg/dL — ABNORMAL HIGH (ref 70–99)
HCT: 35 % — ABNORMAL LOW (ref 39.0–52.0)
Hemoglobin: 11.9 g/dL — ABNORMAL LOW (ref 13.0–17.0)
Potassium: 4.1 mmol/L (ref 3.5–5.1)
Sodium: 140 mmol/L (ref 135–145)
TCO2: 25 mmol/L (ref 22–32)

## 2018-09-03 LAB — POCT ACTIVATED CLOTTING TIME
Activated Clotting Time: 127 seconds
Activated Clotting Time: 104 seconds
Activated Clotting Time: 325 seconds

## 2018-09-03 LAB — POCT I-STAT 4, (NA,K, GLUC, HGB,HCT)
Glucose, Bld: 114 mg/dL — ABNORMAL HIGH (ref 70–99)
Glucose, Bld: 134 mg/dL — ABNORMAL HIGH (ref 70–99)
HCT: 36 % — ABNORMAL LOW (ref 39.0–52.0)
HCT: 33 % — ABNORMAL LOW (ref 39.0–52.0)
Hemoglobin: 12.2 g/dL — ABNORMAL LOW (ref 13.0–17.0)
Hemoglobin: 11.2 g/dL — ABNORMAL LOW (ref 13.0–17.0)
Potassium: 3.7 mmol/L (ref 3.5–5.1)
Potassium: 3.9 mmol/L (ref 3.5–5.1)
Sodium: 140 mmol/L (ref 135–145)
Sodium: 139 mmol/L (ref 135–145)

## 2018-09-03 SURGERY — IMPLANTATION, AORTIC VALVE, TRANSCATHETER, FEMORAL APPROACH
Anesthesia: Monitor Anesthesia Care

## 2018-09-03 MED ORDER — DEXMEDETOMIDINE HCL IN NACL 400 MCG/100ML IV SOLN
INTRAVENOUS | Status: DC | PRN
  Administered 2018-09-03: 1 ug/kg/h via INTRAVENOUS

## 2018-09-03 MED ORDER — ONDANSETRON HCL 4 MG/2ML IJ SOLN: 4.0000 mg | Freq: Four times a day (QID) | INTRAMUSCULAR | Status: DC | PRN

## 2018-09-03 MED ORDER — NITROGLYCERIN IN D5W 200-5 MCG/ML-% IV SOLN: 0.0000 ug/min | INTRAVENOUS | Status: DC

## 2018-09-03 MED ORDER — FENTANYL CITRATE (PF) 100 MCG/2ML IJ SOLN
INTRAMUSCULAR | Status: AC
  Filled 2018-09-03: qty 2

## 2018-09-03 MED ORDER — HEPARIN (PORCINE) IN NACL 1000-0.9 UT/500ML-% IV SOLN
INTRAVENOUS | Status: DC | PRN
  Administered 2018-09-03 (×3): 500 mL

## 2018-09-03 MED ORDER — SODIUM CHLORIDE 0.9% FLUSH: 3.0000 mL | INTRAVENOUS | Status: DC | PRN

## 2018-09-03 MED ORDER — SODIUM CHLORIDE 0.9 % IV SOLN
1.5000 g | Freq: Two times a day (BID) | INTRAVENOUS | Status: DC
  Administered 2018-09-03 – 2018-09-04 (×2): 1.5 g via INTRAVENOUS
  Filled 2018-09-03 (×4): qty 1.5

## 2018-09-03 MED ORDER — PHENYLEPHRINE HCL-NACL 20-0.9 MG/250ML-% IV SOLN: 0.0000 ug/min | INTRAVENOUS | Status: DC

## 2018-09-03 MED ORDER — CHLORHEXIDINE GLUCONATE 4 % EX LIQD
60.0000 mL | Freq: Once | CUTANEOUS | Status: DC
  Filled 2018-09-03: qty 60

## 2018-09-03 MED ORDER — DEXMEDETOMIDINE HCL 200 MCG/2ML IV SOLN
INTRAVENOUS | Status: DC | PRN
  Administered 2018-09-03: 75 ug via INTRAVENOUS

## 2018-09-03 MED ORDER — OXYCODONE HCL 5 MG PO TABS: 5.0000 mg | ORAL_TABLET | ORAL | Status: DC | PRN

## 2018-09-03 MED ORDER — MORPHINE SULFATE (PF) 2 MG/ML IV SOLN: 1.0000 mg | INTRAVENOUS | Status: DC | PRN

## 2018-09-03 MED ORDER — CLOPIDOGREL BISULFATE 75 MG PO TABS
75.0000 mg | ORAL_TABLET | Freq: Every day | ORAL | Status: DC
  Administered 2018-09-04: 09:00:00 75 mg via ORAL
  Filled 2018-09-03: qty 1

## 2018-09-03 MED ORDER — HEPARIN SODIUM (PORCINE) 1000 UNIT/ML IJ SOLN
INTRAMUSCULAR | Status: DC | PRN
  Administered 2018-09-03: 23000 [IU] via INTRAVENOUS

## 2018-09-03 MED ORDER — PROTAMINE SULFATE 10 MG/ML IV SOLN
INTRAVENOUS | Status: DC | PRN
  Administered 2018-09-03 (×3): 50 mg via INTRAVENOUS
  Administered 2018-09-03: 30 mg via INTRAVENOUS
  Administered 2018-09-03: 50 mg via INTRAVENOUS

## 2018-09-03 MED ORDER — LIDOCAINE HCL 1 % IJ SOLN
INTRAMUSCULAR | Status: AC
  Filled 2018-09-03: qty 20

## 2018-09-03 MED ORDER — VANCOMYCIN HCL IN DEXTROSE 1-5 GM/200ML-% IV SOLN
1000.0000 mg | Freq: Once | INTRAVENOUS | Status: AC
  Administered 2018-09-03: 1000 mg via INTRAVENOUS
  Filled 2018-09-03: qty 200

## 2018-09-03 MED ORDER — SODIUM CHLORIDE 0.9 % IV SOLN: 250.0000 mL | INTRAVENOUS | Status: DC | PRN

## 2018-09-03 MED ORDER — SODIUM CHLORIDE 0.9 % IV SOLN
INTRAVENOUS | Status: DC
  Administered 2018-09-03: 12:00:00 50 mL/h via INTRAVENOUS

## 2018-09-03 MED ORDER — NOREPINEPHRINE BITARTRATE 1 MG/ML IV SOLN
INTRAVENOUS | Status: DC | PRN
  Administered 2018-09-03: 10:00:00 1 ug/min via INTRAVENOUS

## 2018-09-03 MED ORDER — ACETAMINOPHEN 650 MG RE SUPP: 650.0000 mg | Freq: Four times a day (QID) | RECTAL | Status: DC | PRN

## 2018-09-03 MED ORDER — CHLORHEXIDINE GLUCONATE 0.12 % MT SOLN
15.0000 mL | Freq: Once | OROMUCOSAL | Status: DC
  Filled 2018-09-03: qty 15

## 2018-09-03 MED ORDER — IOHEXOL 350 MG/ML SOLN
INTRAVENOUS | Status: DC | PRN
  Administered 2018-09-03: 80 mL via INTRA_ARTERIAL

## 2018-09-03 MED ORDER — MIDAZOLAM HCL 2 MG/2ML IJ SOLN
INTRAMUSCULAR | Status: AC
  Filled 2018-09-03: qty 2

## 2018-09-03 MED ORDER — ASPIRIN 81 MG PO CHEW
81.0000 mg | CHEWABLE_TABLET | Freq: Every day | ORAL | Status: DC
  Administered 2018-09-04: 09:00:00 81 mg via ORAL
  Filled 2018-09-03: qty 1

## 2018-09-03 MED ORDER — GLYCOPYRROLATE 0.2 MG/ML IJ SOLN
INTRAMUSCULAR | Status: DC | PRN
  Administered 2018-09-03: 0.1 mg via INTRAVENOUS

## 2018-09-03 MED ORDER — LACTATED RINGERS IV SOLN
INTRAVENOUS | Status: DC | PRN
  Administered 2018-09-03 (×2): via INTRAVENOUS

## 2018-09-03 MED ORDER — ACETAMINOPHEN 325 MG PO TABS: 650.0000 mg | ORAL_TABLET | Freq: Four times a day (QID) | ORAL | Status: DC | PRN

## 2018-09-03 MED ORDER — PHENOL 1.4 % MT LIQD: 1.0000 | OROMUCOSAL | Status: DC | PRN

## 2018-09-03 MED ORDER — SODIUM CHLORIDE 0.9% FLUSH: 3.0000 mL | Freq: Two times a day (BID) | INTRAVENOUS | Status: DC

## 2018-09-03 MED ORDER — LIDOCAINE HCL (PF) 1 % IJ SOLN
INTRAMUSCULAR | Status: DC | PRN
  Administered 2018-09-03 (×2): 10 mL via SUBCUTANEOUS

## 2018-09-03 MED ORDER — PROPOFOL 500 MG/50ML IV EMUL
INTRAVENOUS | Status: DC | PRN
  Administered 2018-09-03: 20 ug/kg/min via INTRAVENOUS

## 2018-09-03 MED ORDER — HEPARIN (PORCINE) IN NACL 1000-0.9 UT/500ML-% IV SOLN
INTRAVENOUS | Status: AC
  Filled 2018-09-03: qty 1500

## 2018-09-03 MED ORDER — SODIUM CHLORIDE 0.9 % IV SOLN: INTRAVENOUS | Status: DC

## 2018-09-03 MED ORDER — FENTANYL CITRATE (PF) 100 MCG/2ML IJ SOLN
INTRAMUSCULAR | Status: DC | PRN
  Administered 2018-09-03 (×2): 50 ug via INTRAVENOUS

## 2018-09-03 MED ORDER — PANTOPRAZOLE SODIUM 40 MG PO TBEC
40.0000 mg | DELAYED_RELEASE_TABLET | Freq: Every day | ORAL | Status: DC
  Administered 2018-09-03 – 2018-09-04 (×2): 40 mg via ORAL
  Filled 2018-09-03 (×2): qty 1

## 2018-09-03 MED ORDER — TRAMADOL HCL 50 MG PO TABS: 50.0000 mg | ORAL_TABLET | ORAL | Status: DC | PRN

## 2018-09-03 MED ORDER — ONDANSETRON HCL 4 MG/2ML IJ SOLN
INTRAMUSCULAR | Status: DC | PRN
  Administered 2018-09-03: 4 mg via INTRAVENOUS

## 2018-09-03 MED ORDER — MIDAZOLAM HCL 2 MG/2ML IJ SOLN
INTRAMUSCULAR | Status: DC | PRN
  Administered 2018-09-03 (×2): 1 mg via INTRAVENOUS

## 2018-09-03 MED ORDER — ROSUVASTATIN CALCIUM 20 MG PO TABS
20.0000 mg | ORAL_TABLET | Freq: Every day | ORAL | Status: DC
  Administered 2018-09-03 – 2018-09-04 (×2): 20 mg via ORAL
  Filled 2018-09-03 (×2): qty 1

## 2018-09-03 MED ORDER — DEXAMETHASONE SODIUM PHOSPHATE 10 MG/ML IJ SOLN
INTRAMUSCULAR | Status: DC | PRN
  Administered 2018-09-03: 4 mg via INTRAVENOUS

## 2018-09-03 SURGICAL SUPPLY — 32 items
BAG SNAP BAND KOVER 36X36 (MISCELLANEOUS) ×10 IMPLANT
BLANKET WARM UNDERBOD FULL ACC (MISCELLANEOUS) ×3 IMPLANT
CABLE ADAPT PACING TEMP 12FT (ADAPTER) ×2 IMPLANT
CATH 29 EDWARDS DELIVERY SYS (CATHETERS) ×2 IMPLANT
CATH DIAG 6FR PIGTAIL ANGLED (CATHETERS) ×4 IMPLANT
CATH INFINITI 6F AL2 (CATHETERS) ×2 IMPLANT
CATH S G BIP PACING (CATHETERS) ×2 IMPLANT
CLOSURE MYNX CONTROL 6F/7F (Vascular Products) ×2 IMPLANT
CRIMPER (MISCELLANEOUS) ×2 IMPLANT
DEVICE CLOSURE PERCLS PRGLD 6F (VASCULAR PRODUCTS) IMPLANT
DEVICE INFLATION ATRION QL38 (MISCELLANEOUS) ×2 IMPLANT
ELECT DEFIB PAD ADLT CADENCE (PAD) ×2 IMPLANT
GUIDEWIRE SAFE TJ AMPLATZ EXST (WIRE) ×2 IMPLANT
KIT HEART LEFT (KITS) ×3 IMPLANT
KIT MICROPUNCTURE NIT STIFF (SHEATH) ×2 IMPLANT
PACK CARDIAC CATHETERIZATION (CUSTOM PROCEDURE TRAY) ×3 IMPLANT
PERCLOSE PROGLIDE 6F (VASCULAR PRODUCTS) ×6
SHEATH 16X36 EDWARDS (SHEATH) ×2 IMPLANT
SHEATH BRITE TIP 7FR 35CM (SHEATH) ×2 IMPLANT
SHEATH PINNACLE 6F 10CM (SHEATH) ×2 IMPLANT
SHEATH PINNACLE 8F 10CM (SHEATH) ×2 IMPLANT
SLEEVE REPOSITIONING LENGTH 30 (MISCELLANEOUS) ×2 IMPLANT
STOPCOCK MORSE 400PSI 3WAY (MISCELLANEOUS) ×6 IMPLANT
TRANSDUCER W/STOPCOCK (MISCELLANEOUS) ×6 IMPLANT
TUBE CONN 8.8X1320 FR HP M-F (CONNECTOR) ×2 IMPLANT
TUBING ART PRESS 72  MALE/FEM (TUBING) ×2
TUBING ART PRESS 72 MALE/FEM (TUBING) IMPLANT
VALVE HEART TRANSCATH SZ3 29MM (Valve) ×2 IMPLANT
WIRE AMPLATZ SS-J .035X180CM (WIRE) ×2 IMPLANT
WIRE EMERALD 3MM-J .035X150CM (WIRE) ×2 IMPLANT
WIRE EMERALD 3MM-J .035X260CM (WIRE) ×2 IMPLANT
WIRE EMERALD ST .035X260CM (WIRE) ×2 IMPLANT

## 2018-09-03 NOTE — Progress Notes (Signed)
  Echocardiogram 2D Echocardiogram limited has been performed.  Marvin Jackson M 09/03/2018, 11:19 AM

## 2018-09-03 NOTE — Anesthesia Procedure Notes (Signed)
Central Venous Catheter Insertion Performed by: Duane Boston, MD, anesthesiologist Start/End7/28/2020 8:24 AM, 09/03/2018 8:34 AM Patient location: Pre-op. Preanesthetic checklist: patient identified, IV checked, site marked, risks and benefits discussed, surgical consent, monitors and equipment checked, pre-op evaluation, timeout performed and anesthesia consent Position: Trendelenburg Lidocaine 1% used for infiltration and patient sedated Hand hygiene performed , maximum sterile barriers used  and Seldinger technique used Catheter size: 8 Fr Total catheter length 16. Central line was placed.Double lumen Procedure performed using ultrasound guided technique. Ultrasound Notes:anatomy identified, needle tip was noted to be adjacent to the nerve/plexus identified, no ultrasound evidence of intravascular and/or intraneural injection and image(s) printed for medical record Attempts: 1 Following insertion, dressing applied, line sutured and Biopatch. Post procedure assessment: blood return through all ports, free fluid flow and no air  Patient tolerated the procedure well with no immediate complications.

## 2018-09-03 NOTE — Anesthesia Procedure Notes (Signed)
Procedure Name: MAC Date/Time: 09/03/2018 9:58 AM Performed by: Teressa Lower., CRNA Pre-anesthesia Checklist: Patient identified, Emergency Drugs available, Suction available, Patient being monitored and Timeout performed Patient Re-evaluated:Patient Re-evaluated prior to induction Oxygen Delivery Method: Simple face mask

## 2018-09-03 NOTE — Progress Notes (Signed)
Patient spouse called an updated on room number and patient. All questions answered will monitor patient. Lottie Siska, [name] rN

## 2018-09-03 NOTE — Progress Notes (Signed)
Patient belongings brought to room from PACU staff [place], [name] RN

## 2018-09-03 NOTE — Op Note (Signed)
HEART AND VASCULAR CENTER   MULTIDISCIPLINARY HEART VALVE TEAM   TAVR OPERATIVE NOTE   Date of Procedure:  09/03/2018  Preoperative Diagnosis: Severe Aortic Stenosis   Postoperative Diagnosis: Same   Procedure:    Transcatheter Aortic Valve Replacement - Percutaneous Right Transfemoral Approach  Edwards Sapien 3 THV (size 29 mm, model # 9600TFX, serial # [id number])   Co-Surgeons:  Salvatore Decentlarence H. Cornelius Moraswen, MD and Tonny BollmanMichael Cooper, MD  Anesthesiologist:  Heather RobertsJames Singer, MD  Echocardiographer:  Charlton HawsPeter Nishan, MD  Pre-operative Echo Findings:  Severe aortic stenosis  Normal left ventricular systolic function  Post-operative Echo Findings:  No paravalvular leak  Normal left ventricular systolic function   BRIEF CLINICAL NOTE AND INDICATIONS FOR SURGERY  Patient is a 73 year old morbidly obese African-American male with history of aortic stenosis, coronary artery disease, chronic diastolic congestive heart failure, hypertension, hyperlipidemia, borderline type 2 diabetes mellitus, GE reflux disease, degenerative arthritis status post left total knee replacement, peripheral arterial disease status post right below-knee amputation, 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  Patient states that he has been told that he has had a heart murmur for at least 6 or 8 years.  In the past he was followed by a cardiologist in [place].  In 2016 he suffered a couple of falls at which time he fractured his right upper arm and his right lower leg.  Both required surgical treatment.  He developed infection in the right lower leg that failed repeat surgical intervention and antibiotic therapy.  [facility] eventually [facility] where he underwent right below-knee amputation.  Prior to amputation he underwent diagnostic cardiac catheterization.  He was told that he had both aortic stenosis and multivessel coronary artery disease but neither were severe enough to merit surgical intervention at that time.  His recovery following amputation was very slow.  He required multiple revisions of his stump which finally healed 3 years later.  He eventually was fitted with a prosthesis and he walks very short distances using a walker.  For the most part he remains wheelchair-bound.  Approximately 1 year ago [place] [facility] [place] he now lives with his wife.  He was referred to Dr. Bing MatterKrasowski first evaluated the patient in March of this year.  Follow-up echocardiogram was ordered and the patient was referred to the multidisciplinary heart valve clinic, but his consultation was delayed because of the COVID-19 pandemic.  Eventually echocardiogram performed July 22, 2018 revealed severe aortic stenosis with preserved left ventricular systolic function.  Left ventricular ejection fraction was estimated > 65%.  Peak velocity across aortic valve measured 4.1 m/s corresponding to mean transvalvular gradient estimated 40 mmHg.  The DVI was reported 0.35 with aortic valve area calculated between 0.91 and 1.09 cm.  Catheterization revealed severe multivessel coronary artery disease with moderate to severe diffuse disease with long segment 70% stenosis of the proximal left anterior descending coronary artery, severe 80% proximal stenosis of the right coronary artery and 80% proximal stenosis of the ramus intermediate branch.  Pulmonary artery pressures were mildly elevated.  Attempts to cross the aortic valve were unsuccessful.  CT angiography was performed and the patient was referred for surgical consultation.  During the course of the patient's preoperative work up they have been evaluated  comprehensively by a multidisciplinary team of specialists coordinated through the Multidisciplinary [facility] Clinic in the [facility].  They have been demonstrated to suffer from symptomatic severe aortic stenosis as noted above. The patient has been counseled extensively as to the relative risks and benefits of all options for the treatment of severe aortic stenosis including long term medical therapy, conventional surgery for aortic valve replacement, and transcatheter aortic valve replacement.  All questions have been answered, and the patient provides full informed consent for the operation as described.   DETAILS OF THE OPERATIVE PROCEDURE  PREPARATION:    The patient is brought to the operating room on the above mentioned date and appropriate monitoring was established by the anesthesia team. The patient is placed in the supine position on the operating table.  Intravenous antibiotics are administered. The patient is monitored closely throughout the procedure under conscious sedation.  Baseline transthoracic echocardiogram was performed. The patient's chest, abdomen, both groins, and both lower extremities are prepared and draped in a sterile manner. A time out procedure is performed.   PERIPHERAL ACCESS:    Using the modified Seldinger technique, femoral arterial and venous access was obtained with placement of 6 Fr sheaths on the left side.  A pigtail diagnostic catheter was passed through the left arterial sheath under fluoroscopic guidance into the aortic root.  A temporary transvenous pacemaker catheter was passed through the left femoral venous sheath under fluoroscopic guidance into the right ventricle.  The pacemaker was tested to ensure stable lead placement and pacemaker capture. Aortic root angiography was performed in order to determine the optimal angiographic angle for valve deployment.   TRANSFEMORAL ACCESS:   Percutaneous transfemoral access and  sheath placement was performed using ultrasound guidance.  The right common femoral artery was cannulated using a micropuncture needle and appropriate location was verified using hand injection angiogram.  A pair of Abbott Perclose percutaneous closure devices were placed and a 6 French sheath replaced into the femoral artery.  The patient was heparinized systemically and ACT verified > 250 seconds.    A 16 Fr transfemoral E-sheath was introduced into the right common femoral artery after progressively dilating over an Amplatz superstiff wire. An AL-2 catheter was used to direct a straight-tip exchange length wire across the native aortic valve into the left ventricle. This was exchanged out for a pigtail catheter and position was confirmed in the LV apex. Simultaneous LV and Ao pressures were recorded.  The pigtail catheter was exchanged for an Amplatz Extra-stiff wire in the LV apex.  Echocardiography was utilized to confirm appropriate wire position and no sign of entanglement in the mitral subvalvular apparatus.   TRANSCATHETER HEART VALVE DEPLOYMENT:   An Edwards Sapien 3 transcatheter heart valve (size 29 mm, model #9600TFX, serial #[id number]) was prepared and crimped per manufacturer's guidelines, and the proper orientation of the valve is confirmed on the [employer] Commander delivery system. The valve was advanced through the introducer sheath using normal technique until in an appropriate position in the abdominal aorta beyond the sheath tip. The balloon was then retracted and using the fine-tuning wheel was centered on the valve. The valve was then advanced across the aortic arch using appropriate flexion of the catheter. The valve was carefully positioned across the aortic valve annulus. The Commander catheter was retracted using normal technique. Once final position of the valve has been confirmed by angiographic assessment, the valve is deployed while temporarily holding ventilation and during rapid  ventricular pacing  to maintain systolic blood pressure < 50 mmHg and pulse pressure < 10 mmHg. The balloon inflation is held for >3 seconds after reaching full deployment volume. Once the balloon has fully deflated the balloon is retracted into the ascending aorta and valve function is assessed using echocardiography. There is felt to be no paravalvular leak and no central aortic insufficiency.  The patient's hemodynamic recovery following valve deployment is good.  The deployment balloon and guidewire are both removed.    PROCEDURE COMPLETION:   The sheath was removed and femoral artery closure performed.  Protamine was administered once femoral arterial repair was complete. The temporary pacemaker, pigtail catheters and femoral sheaths were removed with manual pressure used for hemostasis.  A Mynx femoral closure device was utilized following removal of the diagnostic sheath in the left femoral artery.  The patient tolerated the procedure well and is transported to the surgical intensive care in stable condition. There were no immediate intraoperative complications. All sponge instrument and needle counts are verified correct at completion of the operation.   No blood products were administered during the operation.  The patient received a total of 80 mL of intravenous contrast during the procedure.   Rexene Alberts, MD 09/03/2018 11:44 AM

## 2018-09-03 NOTE — Discharge Instructions (Signed)

## 2018-09-03 NOTE — Anesthesia Postprocedure Evaluation (Signed)
Anesthesia Post Note  Patient: Marvin Jackson  Procedure(s) Performed: TRANSCATHETER AORTIC VALVE REPLACEMENT, TRANSFEMORAL (N/A ) TRANSESOPHAGEAL ECHOCARDIOGRAM (TEE) (N/A )     Patient location during evaluation: PACU Anesthesia Type: MAC Level of consciousness: awake and alert Pain management: pain level controlled Vital Signs Assessment: post-procedure vital signs reviewed and stable Respiratory status: spontaneous breathing and respiratory function stable Cardiovascular status: stable Postop Assessment: no apparent nausea or vomiting Anesthetic complications: no    Last Vitals:  Vitals:   09/03/18 1205 09/03/18 1210  BP: (!) 99/55 92/64  Pulse: (!) 52 (!) 52  Resp: 16 17  Temp: (!) 36.2 C   SpO2: 96% 98%    Last Pain:  Vitals:   09/03/18 1144  TempSrc:   PainSc: 0-No pain                 Keoki Mchargue DANIEL

## 2018-09-03 NOTE — Progress Notes (Signed)
Patient arrived from cath lab patient with B groins level 0 patient placed on monitor and vital signs obtained. Will monitor patient  Marvin Jackson, [name]

## 2018-09-03 NOTE — Op Note (Signed)
HEART AND VASCULAR CENTER   MULTIDISCIPLINARY HEART VALVE TEAM   TAVR OPERATIVE NOTE   Date of Procedure:  09/03/2018  Preoperative Diagnosis: Severe Aortic Stenosis   Postoperative Diagnosis: Same   Procedure:    Transcatheter Aortic Valve Replacement - Percutaneous Transfemoral Approach  Edwards Sapien 3 THV (size 29 mm, model # 9600TFX, serial # [id number])   Co-Surgeons:  Salvatore Decentlarence H. Cornelius Moraswen, MD, MD and Tonny BollmanMichael Margaree Sandhu, MD  Anesthesiologist:  Heather RobertsJames Singer, MD  Echocardiographer:  Charlton HawsPeter Nishan, MD  Pre-operative Echo Findings:  Severe aortic stenosis  Normal left ventricular systolic function  Post-operative Echo Findings:  No paravalvular leak  Normal left ventricular systolic function  BRIEF CLINICAL NOTE AND INDICATIONS FOR SURGERY  This is a 73 year old gentleman with multiple medical problems including chronic diastolic heart failure, borderline diabetes, advanced degenerative arthritis, peripheral arterial disease status post right BKA, morbid obesity, and progressive now severe aortic stenosis, presenting for TAVR.  During the course of the patient's preoperative work up they have been evaluated comprehensively by a multidisciplinary team of specialists coordinated through the Multidisciplinary Heart Valve Clinic in the [facility].  They have been demonstrated to suffer from symptomatic severe aortic stenosis as noted above. The patient has been counseled extensively as to the relative risks and benefits of all options for the treatment of severe aortic stenosis including long term medical therapy, conventional surgery for aortic valve replacement, and transcatheter aortic valve replacement.  The patient has been independently evaluated in formal cardiac surgical consultation by Dr Cornelius Moraswen, who deemed the patient appropriate for TAVR. Based upon review of all of the patient's preoperative diagnostic tests they are felt to be candidate for  transcatheter aortic valve replacement using the transfemoral approach as an alternative to conventional surgery.    Following the decision to proceed with transcatheter aortic valve replacement, a discussion has been held regarding what types of management strategies would be attempted intraoperatively in the event of life-threatening complications, including whether or not the patient would be considered a candidate for the use of cardiopulmonary bypass and/or conversion to open sternotomy for attempted surgical intervention.  The patient has been advised of a variety of complications that might develop peculiar to this approach including but not limited to risks of death, stroke, paravalvular leak, aortic dissection or other major vascular complications, aortic annulus rupture, device embolization, cardiac rupture or perforation, acute myocardial infarction, arrhythmia, heart block or bradycardia requiring permanent pacemaker placement, congestive heart failure, respiratory failure, renal failure, pneumonia, infection, other late complications related to structural valve deterioration or migration, or other complications that might ultimately cause a temporary or permanent loss of functional independence or other long term morbidity.  The patient provides full informed consent for the procedure as described and all questions were answered preoperatively.  DETAILS OF THE OPERATIVE PROCEDURE  PREPARATION:   The patient is brought to the operating room on the above mentioned date and central monitoring was established by the anesthesia team including placement of a central venous catheter and radial arterial line. The patient is placed in the supine position on the operating table.  Intravenous antibiotics are administered. The patient is monitored closely throughout the procedure under conscious sedation.  Baseline transthoracic echocardiogram is performed. The patient's chest, abdomen, both groins, and  both lower extremities are prepared and draped in a sterile manner. A time out procedure is performed.   PERIPHERAL ACCESS:   Using ultrasound guidance, femoral arterial and venous access is obtained with placement of  6 Fr sheaths on the left side.  A pigtail diagnostic catheter was passed through the femoral arterial sheath under fluoroscopic guidance into the aortic root.  A temporary transvenous pacemaker catheter was passed through the femoral venous sheath under fluoroscopic guidance into the right ventricle.  The pacemaker was tested to ensure stable lead placement and pacemaker capture. Aortic root angiography was performed in order to determine the optimal angiographic angle for valve deployment.  TRANSFEMORAL ACCESS:  A micropuncture technique is used to access the right femoral artery under fluoroscopic and ultrasound guidance.  2 Perclose devices are deployed at 10' and 2' positions to 'PreClose' the femoral artery. An 8 French sheath is placed and then an Amplatz Superstiff wire is advanced through the sheath. This is changed out for a 16 French transfemoral E-Sheath after progressively dilating over the Superstiff wire.  An AL-2 catheter was used to direct a straight-tip exchange length wire across the native aortic valve into the left ventricle. This was exchanged out for a pigtail catheter and position was confirmed in the LV apex. Simultaneous LV and Ao pressures were recorded.  The pigtail catheter was exchanged for an Amplatz Extra-stiff wire in the LV apex.    BALLOON AORTIC VALVULOPLASTY:  Not performed  TRANSCATHETER HEART VALVE DEPLOYMENT:  An Edwards Sapien 3 transcatheter heart valve (size 29 mm) was prepared and crimped per manufacturer's guidelines, and the proper orientation of the valve is confirmed on the [employer] delivery system. The valve was advanced through the introducer sheath using normal technique until in an appropriate position in the abdominal aorta  beyond the sheath tip. The balloon was then retracted and using the fine-tuning wheel was centered on the valve. The valve was then advanced across the aortic arch using appropriate flexion of the catheter. The valve was carefully positioned across the aortic valve annulus. The Commander catheter was retracted using normal technique. Once final position of the valve has been confirmed by angiographic assessment, the valve is deployed while temporarily holding ventilation and during rapid ventricular pacing to maintain systolic blood pressure < 50 mmHg and pulse pressure < 10 mmHg. The balloon inflation is held for >3 seconds after reaching full deployment volume. Once the balloon has fully deflated the balloon is retracted into the ascending aorta and valve function is assessed using echocardiography. The patient's hemodynamic recovery following valve deployment is good.  The deployment balloon and guidewire are both removed. Echo demostrated acceptable post-procedural gradients, stable mitral valve function, and no aortic insufficiency.     PROCEDURE COMPLETION:  The sheath was removed and femoral artery closure is performed using the 2 previously deployed Perclose devices.  Protamine is administered once femoral arterial repair was complete. The site is clear with no evidence of bleeding or hematoma after the sutures are tightened. The temporary pacemaker and pigtail catheters are removed. Mynx closure is used for left femoral artery hemostasis.  The left femoral venous sheath is pulled with manual pressure used for hemostasis.  The patient tolerated the procedure well and is transported to the surgical intensive care in stable condition. There were no immediate intraoperative complications. All sponge instrument and needle counts are verified correct at completion of the operation.   The patient received a total of 80 mL of intravenous contrast during the procedure.   Sherren Mocha,  MD 09/03/2018 2:36 PM

## 2018-09-03 NOTE — Progress Notes (Signed)
  [place] VALVE TEAM  Patient doing well s/p TAVR. He is hemodynamically stable. Groin sites stable. ECG with 1st degree AV block but no high grade block. Arterial line discontinued and transferred to 4E. Plan for early ambulation after bedrest completed and hopeful discharge over the next 24-48 hours.   Angelena Form PA-C  [facility]  Pager [phone]

## 2018-09-03 NOTE — Anesthesia Procedure Notes (Signed)
Arterial Line Insertion Start/End7/28/2020 8:15 AM, 09/03/2018 8:45 AM Performed by: Leonor Liv, CRNA, CRNA  Patient location: Pre-op. Preanesthetic checklist: patient identified, IV checked, site marked, risks and benefits discussed, surgical consent, monitors and equipment checked, pre-op evaluation, timeout performed and anesthesia consent Lidocaine 1% used for infiltration and patient sedated Right, radial was placed Catheter size: 20 G Hand hygiene performed  and maximum sterile barriers used  Allen's test indicative of satisfactory collateral circulation Attempts: 2 Procedure performed without using ultrasound guided technique. Following insertion, Biopatch and dressing applied. Post procedure assessment: normal  Patient tolerated the procedure well with no immediate complications.

## 2018-09-03 NOTE — H&P (Addendum)
Cardiology Admission History and Physical:   Patient ID: Marvin Jackson MRN: [id number]; DOB: [birth year]   Admission date: 09/03/2018  Primary Care Provider: Elie GoodyBryan, Taressa, NP Primary Cardiologist: No primary care provider on file.  Primary Electrophysiologist:  None   Chief Complaint:  Shortness of breath  Patient Profile:   Marvin Jackson is a 73 y.o. male with multiple medical problems including aortic stenosis and obstructive coronary artery disease, chronic diastolic heart failure, borderline diabetes, obesity, history of left total knee replacement, right below-knee amputation, and severe functional limitation, presenting today for TAVR.  History of Present Illness:   Mr. Marvin Jackson is a 73 year old gentleman with the above medical problems who has developed severe, stage D1 aortic stenosis with progressive dyspnea.  His most recent echocardiogram in June 2020 revealed severe aortic stenosis with preserved LV systolic function, LVEF greater than 65%.  Peak transvalvular velocity was 4.1 m/s with a mean gradient of 40 mmHg.  Cardiac catheterization revealed multivessel CAD with 70% stenosis of the LAD, 80% stenosis of the RCA, and 80% stenosis of a large intermediate branch.  The patient reports no clinical changes since his office visit with Dr. Cornelius Moraswen August 05, 2018.  He has shortness of breath with low-level activity.  There is no chest pain, PND, orthopnea, lightheadedness, or syncope.      Past Medical History:  Diagnosis Date  . Anemia 02/02/2015  . Arthritis   . CHF (congestive heart failure) (HCC)   . Chronic diastolic heart failure (HCC) 08/29/2015  . Coronary artery disease involving native coronary artery of native heart without angina pectoris    [facility]    . ED (erectile dysfunction) of organic origin   . Essential hypertension 02/02/2015  . Gastroesophageal reflux disease without esophagitis   . Heart murmur   . History of total left knee  replacement   . Inguinal hernia   . Limited mobility 08/05/2018  . Lymphedema of both lower extremities   . Morbid obesity (HCC)   . Peripheral vascular disease, unspecified (HCC)   . PONV (postoperative nausea and vomiting)   . Prediabetes   . Pure hypercholesterolemia 08/29/2015  . Status post below knee amputation of right lower extremity (HCC)   . Vitamin D deficiency disease     Past Surgical History:  Procedure Laterality Date  . BELOW KNEE LEG AMPUTATION Right 2017   [facility]  . HERNIA REPAIR    . JOINT REPLACEMENT    . ORIF HUMERUS FRACTURE Right 2016  . ORIF PROXIMAL TIBIAL PLATEAU FRACTURE Right 2016  . RIGHT/LEFT HEART CATH AND CORONARY ANGIOGRAPHY N/A 07/22/2018   Procedure: RIGHT/LEFT HEART CATH AND CORONARY ANGIOGRAPHY;  Surgeon: Tonny Bollmanooper, Temari Schooler, MD;  [facility];  Service: Cardiovascular;  Laterality: N/A;  . TOTAL KNEE ARTHROPLASTY Left      Medications Prior to Admission: Prior to Admission medications   Medication Sig Start Date End Date Taking? Authorizing Provider  amLODipine (NORVASC) 10 MG tablet Take 10 mg by mouth daily. 03/11/18  Yes [provider]  amoxicillin (AMOXIL) 500 MG capsule Take four capsules one hour before dental appointment. Patient taking differently: Take 2,000 mg by mouth See admin instructions. Take 2000 mg by mouth one hour before dental appointment. 08/07/18  Yes Charlynne PanderKulinski, Ronald F, DDS  aspirin 81 MG chewable tablet Chew 81 mg by mouth daily.  02/20/17  Yes [provider]  Cholecalciferol 125 MCG (5000 UT) TABS Take 5,000 Units by mouth daily.   Yes  [provider]  diclofenac (VOLTAREN) 75 MG EC tablet Take 75 mg by mouth 2 (two) times daily.  01/01/18  Yes [provider]  furosemide (LASIX) 40 MG tablet Take 80 mg by mouth daily.  01/26/17  Yes [provider]  lisinopril (PRINIVIL,ZESTRIL) 40 MG tablet Take 40 mg by mouth daily. 03/15/18  Yes [provider]  omeprazole  (PRILOSEC) 20 MG capsule Take 20 mg by mouth daily.  12/26/17  Yes [provider]  rosuvastatin (CRESTOR) 20 MG tablet Take 20 mg by mouth daily.  03/12/18  Yes [provider]  vitamin B-12 (CYANOCOBALAMIN) 500 MCG tablet Take 500 mcg by mouth 2 (two) times a day.    Yes [provider]     Allergies:   No Known Allergies  Social History:   Social History   Socioeconomic History  . Marital status: Married    Spouse name: Not on file  . Number of children: Not on file  . Years of education: Not on file  . Highest education level: Not on file  Occupational History  . Not on file  Social Needs  . Financial resource strain: Not on file  . Food insecurity    Worry: Not on file    Inability: Not on file  . Transportation needs    Medical: Not on file    Non-medical: Not on file  Tobacco Use  . Smoking status: Never Smoker  . Smokeless tobacco: Former Systems developer    Types: Chew, Snuff  Substance and Sexual Activity  . Alcohol use: Not Currently  . Drug use: Never  . Sexual activity: Not on file  Lifestyle  . Physical activity    Days per week: Not on file    Minutes per session: Not on file  . Stress: Not on file  Relationships  . Social Herbalist on phone: Not on file    Gets together: Not on file    Attends religious service: Not on file    Active member of club or organization: Not on file    Attends meetings of clubs or organizations: Not on file    Relationship status: Not on file  . Intimate partner violence    Fear of current or ex partner: Not on file    Emotionally abused: Not on file    Physically abused: Not on file    Forced sexual activity: Not on file  Other Topics Concern  . Not on file  Social History Narrative  . Not on file    Family History:   The patient's family history includes Cancer in his father; Heart disease in his father; Hypertension in his father and mother.    ROS:  Please see the history of present  illness.  All other ROS reviewed and negative.     Physical Exam/Data:   Vitals:   09/03/18 0741  BP: (S) 132/65  Pulse: 68  Resp: (S) 20  Temp: 98.8 F (37.1 C)  TempSrc: Oral   No intake or output data in the 24 hours ending 09/03/18 0939 Last 3 Weights 08/30/2018 08/05/2018 07/22/2018  Weight (lbs) 330 lb 330 lb 335 lb  Weight (kg) 149.687 kg 149.687 kg 151.955 kg     There is no height or weight on file to calculate BMI.  General: Pleasant obese male, in no acute distress HEENT: normal Lymph: no adenopathy Neck: no JVD Endocrine:  No thryomegaly Vascular: No carotid bruits; FA pulses 2+ bilaterally  Cardiac:  normal S1, S2; RRR; 3/6 harsh systolic murmur at the right upper sternal border Lungs:  clear to auscultation bilaterally, no wheezing, rhonchi or rales  Abd: soft, nontender, no hepatomegaly  Ext: Right BKA, left leg edema noted Musculoskeletal:  No deformities Skin: warm and dry  Neuro:  CNs 2-12 intact, no focal abnormalities noted Psych:  Normal affect    EKG:  The ECG that was done 08/30/2018 was personally reviewed and demonstrates sinus bradycardia with first-degree AV block, heart rate 55 bpm  Relevant CV Studies: 2D echocardiogram 07/22/2018: IMPRESSIONS    1. The left ventricle has hyperdynamic systolic function, with an ejection fraction of >65%. The cavity size was normal. Left ventricular diastolic Doppler parameters are consistent with impaired relaxation.  2. The right ventricle has normal systolic function. The cavity was normal. There is no increase in right ventricular wall thickness.  3. Left atrial size was moderately dilated.  4. Mild thickening of the mitral valve leaflet. Mild calcification of the mitral valve leaflet. There is mild mitral annular calcification present.  5. The tricuspid valve is grossly normal.  6. The aortic valve was not well visualized. Severely thickening of the aortic valve. Severe calcifcation of the aortic valve.  Moderate-severe stenosis of the aortic valve.  7. Poor quality images. Calcified but visible opening Overall appears to be tri leaflet with moderate to severe AS.  8. The interatrial septum was not well visualized.  Cardiac Cath 07/22/2018: Conclusion    Ramus lesion is 80% stenosed.  Prox LAD to Mid LAD lesion is 70% stenosed.  Prox RCA lesion is 80% stenosed.  There is severe aortic valve stenosis.   1.  Severe multivessel coronary artery disease with severe stenosis of the proximal ramus intermedius, moderately severe diffuse proximal LAD stenosis, and severe proximal RCA stenosis 2.  Essentially normal right heart hemodynamics 3.  Severely calcified and restricted aortic valve biplane fluoroscopy, unable to measure transaortic gradients secondary to anatomic issues detailed above with severe subclavian tortuosity and inability to reach the ventricle with standard catheters.  Recommendations: Will obtain a 2D echocardiogram prior to discharge today.  The patient will need surgical consultation for further consideration of treatment options including CABG/aortic valve replacement versus multivessel PCI and TAVR.   CTA Heart: IMPRESSION: 1. Trileaflet aortic valve with severe leaflet calcifications and thickening, and severely reduced leaflet excursion  2. AV calcium score: 2731  3. Normal caliber aorta. Mild mixed atherosclerotic plaque in aortic arch. Conventional 3 vessel branch pattern of aortic arch with tortuous arch vessels where visualized proximally.  4.  Adequate coronary heights, high anterior takeoff of RCA.  5. Virtual annulus measurements - area: 549 mm2. Based on these measurements, the annulus is best suited to a 29 mm Sapien 3 valve.  6.  Optimum Fluoroscopic Angle for Delivery: LAO 0, CAU 0  CTA Chest, Abd, Pelvis: VASCULAR MEASUREMENTS PERTINENT TO TAVR:  AORTA:  Minimal Aortic Diameter-17 x 17 mm  Severity of Aortic Calcification-mild   RIGHT PELVIS:  Right Common Iliac Artery -  Minimal Diameter-12.2 x 11.2 mm  Tortuosity-mild  Calcification-moderate  Right External Iliac Artery -  Minimal Diameter-11.2 x 10.0 mm  Tortuosity-moderate  Calcification-none  Right Common Femoral Artery -  Minimal Diameter-9.8 x 7.7 mm  Tortuosity-mild  Calcification-moderate  LEFT PELVIS:  Left Common Iliac Artery -  Minimal Diameter-11.4 x 14.0 mm  Tortuosity-mild  Calcification-moderate  Left External Iliac Artery -  Minimal Diameter-10.6 x 9.7 mm  Tortuosity-mild-to-moderate  Calcification-none  Left  Common Femoral Artery -  Minimal Diameter-10.9 x 8.2 mm  Tortuosity-mild  Calcification-moderate  Review of the MIP images confirms the above findings.  IMPRESSION: 1. Vascular findings and measurements pertinent to potential TAVR procedure, as detailed above. 2. Severe thickening calcification of the aortic valve, compatible with the reported clinical history of severe aortic stenosis. 3. Aortic atherosclerosis, in addition to left main and 3 vessel coronary artery disease. 4. Colonic diverticulosis without evidence of acute diverticulitis at this time. 5. Additional incidental findings, as above.  Laboratory Data:  High Sensitivity Troponin:  No results for input(s): TROPONINIHS in the last 720 hours.    Cardiac EnzymesNo results for input(s): TROPONINI in the last 168 hours. No results for input(s): TROPIPOC in the last 168 hours.  Chemistry Recent Labs  Lab 08/30/18 1210  NA 140  K 4.0  CL 108  CO2 22  GLUCOSE 99  BUN 11  CREATININE 0.73  CALCIUM 9.1  GFRNONAA >60  GFRAA >60  ANIONGAP 10    Recent Labs  Lab 08/30/18 1210  PROT 7.3  ALBUMIN 3.9  AST 14*  ALT 13  ALKPHOS 88  BILITOT 0.4   Hematology Recent Labs  Lab 08/30/18 1210  WBC 9.4  RBC 4.77  HGB 11.0*  HCT 37.6*  MCV 78.8*  MCH 23.1*  MCHC 29.3*  RDW 18.7*  PLT 276   BNP  Recent Labs  Lab 08/30/18 1149  BNP 113.9*    DDimer No results for input(s): DDIMER in the last 168 hours.   Radiology/Studies:  No results found.  Assessment and Plan:   1. Severe, stage D1 aortic stenosis 2. New York Heart Association functional class III chronic diastolic heart failure 3. Multivessel coronary artery disease without angina  The patient presents today for TAVR.  All of his preoperative studies are reviewed in detail by our multidisciplinary heart valve team.  We plan to proceed with a transcatheter heart valve from percutaneous transfemoral access.  All treatment options have been considered and the patient is not felt to be a candidate for CABG/aortic valve replacement because of severe comorbidities outlined above.  The patient and his family members have been counseled extensively about treatment options and specifically about risks, indications, and alternatives to TAVR.  They have provided full informed consent for the procedure.  For questions or updates, please contact [facility] Please consult [email or web address] for contact info under   Signed, Tonny BollmanMichael Zaineb Nowaczyk, MD  09/03/2018 9:39 AM

## 2018-09-03 NOTE — Transfer of Care (Signed)
Immediate Anesthesia Transfer of Care Note  Patient: Marvin Jackson  Procedure(s) Performed: TRANSCATHETER AORTIC VALVE REPLACEMENT, TRANSFEMORAL (N/A ) TRANSESOPHAGEAL ECHOCARDIOGRAM (TEE) (N/A )  Patient Location: Cath Lab  Anesthesia Type:MAC  Level of Consciousness: awake, alert  and oriented  Airway & Oxygen Therapy: Patient Spontanous Breathing and Patient connected to nasal cannula oxygen  Post-op Assessment: Report given to RN and Post -op Vital signs reviewed and stable  Post vital signs: Reviewed and stable  Last Vitals:  Vitals Value Taken Time  BP 112/60 09/03/18 1134  Temp    Pulse 52 09/03/18 1135  Resp 15 09/03/18 1135  SpO2 96 % 09/03/18 1135  Vitals shown include unvalidated device data.  Last Pain:  Vitals:   09/03/18 0741  TempSrc: Oral         Complications: No apparent anesthesia complications

## 2018-09-04 ENCOUNTER — Encounter: Payer: Self-pay | Admitting: Thoracic Surgery (Cardiothoracic Vascular Surgery)

## 2018-09-04 ENCOUNTER — Inpatient Hospital Stay (HOSPITAL_COMMUNITY): Payer: Medicare PPO

## 2018-09-04 DIAGNOSIS — Z952 Presence of prosthetic heart valve: Secondary | ICD-10-CM

## 2018-09-04 DIAGNOSIS — I350 Nonrheumatic aortic (valve) stenosis: Principal | ICD-10-CM

## 2018-09-04 LAB — BASIC METABOLIC PANEL
Anion gap: 8 (ref 5–15)
BUN: 10 mg/dL (ref 8–23)
CO2: 24 mmol/L (ref 22–32)
Calcium: 8.9 mg/dL (ref 8.9–10.3)
Chloride: 103 mmol/L (ref 98–111)
Creatinine, Ser: 0.71 mg/dL (ref 0.61–1.24)
GFR calc Af Amer: >60 mL/min (ref >60)
GFR calc non Af Amer: >60 mL/min (ref >60)
Glucose, Bld: 113 mg/dL — ABNORMAL HIGH (ref 70–99)
Potassium: 4 mmol/L (ref 3.5–5.1)
Sodium: 135 mmol/L (ref 135–145)

## 2018-09-04 LAB — CBC
HCT: 34 % — ABNORMAL LOW (ref 39.0–52.0)
Hemoglobin: 10.3 g/dL — ABNORMAL LOW (ref 13.0–17.0)
MCH: 23.6 pg — ABNORMAL LOW (ref 26.0–34.0)
MCHC: 30.3 g/dL (ref 30.0–36.0)
MCV: 77.8 fL — ABNORMAL LOW (ref 80.0–100.0)
Platelets: 262 10*3/uL (ref 150–400)
RBC: 4.37 MIL/uL (ref 4.22–5.81)
RDW: 18.2 % — ABNORMAL HIGH (ref 11.5–15.5)
WBC: 16.8 10*3/uL — ABNORMAL HIGH (ref 4.0–10.5)
nRBC: 0 % (ref 0.0–0.2)

## 2018-09-04 LAB — MAGNESIUM: Magnesium: 2 mg/dL (ref 1.7–2.4)

## 2018-09-04 LAB — ECHOCARDIOGRAM COMPLETE
Height: 75 in
Weight: 5760.18 oz

## 2018-09-04 MED ORDER — PANTOPRAZOLE SODIUM 40 MG PO TBEC: 40.0000 mg | DELAYED_RELEASE_TABLET | Freq: Every day | ORAL | 1 refills | Status: DC

## 2018-09-04 MED ORDER — CLOPIDOGREL BISULFATE 75 MG PO TABS: 75.0000 mg | ORAL_TABLET | Freq: Every day | ORAL | 1 refills | Status: DC

## 2018-09-04 MED ORDER — ASPIRIN 81 MG PO CHEW: 81.0000 mg | CHEWABLE_TABLET | Freq: Every day | ORAL | 1 refills | Status: DC

## 2018-09-04 MED ORDER — FUROSEMIDE 80 MG PO TABS: 80.0000 mg | ORAL_TABLET | Freq: Every day | ORAL | Status: DC

## 2018-09-04 MED ORDER — LISINOPRIL 40 MG PO TABS: 40.0000 mg | ORAL_TABLET | Freq: Every day | ORAL | Status: DC

## 2018-09-04 MED FILL — Lidocaine HCl Local Inj 1%: INTRAMUSCULAR | Qty: 20 | Status: AC

## 2018-09-04 MED FILL — PANTOPRAZOLE SOD DR 40 MG T: 40 | 90 days supply | Qty: 90 | Fill #0

## 2018-09-04 MED FILL — CLOPIDOGREL 75 MG TABLET: 75 | 90 days supply | Qty: 90 | Fill #0

## 2018-09-04 MED FILL — ASPIRIN LOW DOSE 81 MG CHEW: 81 | 90 days supply | Qty: 90 | Fill #0

## 2018-09-04 NOTE — Plan of Care (Signed)
  Problem: Education: Goal: Knowledge of General Education information will improve Description: Including pain rating scale, medication(s)/side effects and non-pharmacologic comfort measures Outcome: Progressing   Problem: Health Behavior/Discharge Planning: Goal: Ability to manage health-related needs will improve Outcome: Progressing   Problem: Clinical Measurements: Goal: Ability to maintain clinical measurements within normal limits will improve Outcome: Progressing Goal: Will remain free from infection Outcome: Progressing Goal: Diagnostic test results will improve Outcome: Progressing Goal: Cardiovascular complication will be avoided Outcome: Progressing   Problem: Activity: Goal: Risk for activity intolerance will decrease Outcome: Progressing   Problem: Nutrition: Goal: Adequate nutrition will be maintained Outcome: Progressing   Problem: Elimination: Goal: Will not experience complications related to bowel motility Outcome: Progressing Goal: Will not experience complications related to urinary retention Outcome: Progressing   Problem: Pain Managment: Goal: General experience of comfort will improve Outcome: Progressing   Problem: Safety: Goal: Ability to remain free from injury will improve Outcome: Progressing   Problem: Skin Integrity: Goal: Risk for impaired skin integrity will decrease Outcome: Progressing   Problem: Clinical Measurements: Goal: Respiratory complications will improve Outcome: Not Applicable   Problem: Coping: Goal: Level of anxiety will decrease Outcome: Not Applicable   

## 2018-09-04 NOTE — Discharge Summary (Addendum)
[facility]   MULTIDISCIPLINARY HEART VALVE TEAM  Discharge Summary    Patient ID: Marvin Jackson MRN: [id number]; DOB: 19-Jul-1945  Admit date: 09/03/2018 Discharge date: 09/04/2018  Primary Care Provider: Elie GoodyBryan, Taressa, NP  Primary Cardiologist: Dr. Bing MatterKrasowski / Dr. Excell Seltzerooper & Dr. 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   Essential hypertension   Gastroesophageal reflux disease without esophagitis   Peripheral vascular disease, unspecified (HCC)   Pure hypercholesterolemia   Critical aortic valve stenosis   Morbid obesity (HCC)   Hx of right BKA (HCC)   Acute on chronic diastolic heart failure (HCC)   Allergies No Known Allergies  Diagnostic Studies/Procedures    TAVR OPERATIVE NOTE   Date of Procedure:                09/03/2018  Preoperative Diagnosis:      Severe Aortic Stenosis   Postoperative Diagnosis:    Same   Procedure:        Transcatheter Aortic Valve Replacement - Percutaneous Right Transfemoral Approach             Edwards Sapien 3 THV (size 29 mm, model # 9600TFX, serial # [id number])              Co-Surgeons:                        Salvatore Decentlarence H. Cornelius Moraswen, MD and Tonny BollmanMichael Jnaya Butrick, MD  Anesthesiologist:                  Heather RobertsJames Singer, MD  Echocardiographer:              Charlton HawsPeter Nishan, MD  Pre-operative Echo Findings: ? Severe aortic stenosis ? Normal left ventricular systolic function  Post-operative Echo Findings: ? No paravalvular leak ? Normal left ventricular systolic function  _____________    Echo 09/04/18: pending formal read at the time of discharge.   History of Present Illness     Marvin Jackson is a 73 y.o. 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   Patient states that he has been told that he has had a heart murmur for at least 6 or 8 years. In the past he was followed by a cardiologist in [place]. In 2016 he suffered a couple of falls at which time he fractured his right upper arm and his right lower leg. Both required surgical treatment. He developed infection in the right lower leg that failed repeat surgical intervention and antibiotic therapy.  [facility] was [facility] where he underwent right below-knee amputation. Prior to amputation he underwent diagnostic cardiac catheterization. He was told that he had both aortic stenosis and multivessel coronary artery disease but neither were severe enough to merit surgical intervention at that time. His recovery following amputation was very slow. He required multiple revisions of his stump which finally healed 3 years later. He eventually was  fitted with a prosthesis and he walks very short distances using a walker. For the most part he remains wheelchair-bound.  Approximately 1 year ago the patient moved to [facility] where he now lives with his wife. He was referred to Dr. Agustin Cree first evaluated the patient in March of this year. Follow-up echocardiogram was ordered and the patient was referred to the multidisciplinary heart valve clinic, but his consultation was delayed because of the COVID-19 pandemic. Eventually echocardiogram performed July 22, 2018 revealed severe aortic stenosis with preserved left ventricular systolic function. Left ventricular ejection fraction was estimated > 65%.  Peak velocity across  aortic valve measured 4.1 m/s corresponding to mean transvalvular gradient estimated 40 mmHg. The DVI was reported 0.35 with aortic valve area calculated between 0.91 and 1.09 cm.  Catheterization revealed severe multivessel coronary artery disease with moderate to severe diffuse disease with long segment 70% stenosis of the proximal left anterior descending coronary artery, severe 80% proximal stenosis of the right coronary artery and 80% proximal stenosis of the ramus intermediate branch. He was evaluated by Dr Roxy Manns, it was felt that based upon coronary anatomy the patient's long-term prognosis would probably be best served with conventional surgical aortic valve replacement and coronary artery bypass grafting.  However, he would not consider this patient a candidate for conventional surgery because of his severe comorbid medical problems including previous right below-knee amputation with significant peripheral arterial disease, extremely limited physical mobility and poor functional status, severe morbid obesity, and demonstrated problems with wound healing and recovery from previous surgical operations. It is possible that the patient's coronary disease could be treated with PCI and stenting if necessary, although the left anterior descending coronary artery is fairly diffusely diseased. Plan was made for medical therapy for CAD and TAVR for severe AS, which was set up for 09/03/18.  Hospital Course     Consultants: none   Severe AS: s/p successful TAVR with a 29 mm Edwards Sapien 3 THV via the TF approach on 09/03/18. Post operative echo pending. Groin sites are stable. ECG with sinus w/ old 1st deg AV block and no high grade heart block. Continue Asprin and started on plavix. All meds were brought to bedside prior to discharge through the [facility].  HTN: resume home regimen  CAD: continue medical therapy with DAPT and statin.   Acute on chronic diastolic CHF: as evidenced by a mildly  elevated BNP on preadmission lab work. This was probably falsely low in the setting of morbid obesity. This has been treated with TAVR. He has some wheezing on exam. Volume is difficult to assess given body habitus. Will plan to resume home lasix 80mg  daily, but ask him to take 80 mg BID x 3 days. I will check a BMET next week.    Fever: patient with mild fever and white count a little more elevated. He has no localizing symptoms. Likely 2/2 post op atelectasis   GERD: home prilosec has been switched to protonix given potential interaction with plavix.  _____________  Discharge Vitals Blood pressure 129/75, pulse 81, temperature (!) 100.4 F (38 C), temperature source Oral, resp. rate (!) 21, height 6\' 3"  (1.905 m), weight (!) 163.3 kg, SpO2 98 %.  Filed Weights   09/04/18 0325  Weight: (!) 163.3 kg   GEN: morbidly obese HEENT: normal Neck: no JVD or masses Cardiac:  RRR; soft flow murmur. No rubs, or gallops,no edema  Respiratory:  clear to auscultation bilaterally, normal work of breathing. Audible  wheezing without auscultation GI: soft, nontender, nondistended, + BS MS: no deformity or atrophy. S/p R BKA Skin: warm and dry, no rash.  Groin sites clear without hematoma or ecchymosis  Neuro:  Alert and Oriented x 3, Strength and sensation are intact Psych: euthymic mood, full affect   Labs & Radiologic Studies    CBC Recent Labs    09/03/18 1157 09/04/18 0329  WBC  --  16.8*  HGB 11.9* 10.3*  HCT 35.0* 34.0*  MCV  --  77.8*  PLT  --  262   Basic Metabolic Panel Recent Labs    16/10/96 1157 09/04/18 0329  NA 140 135  K 4.1 4.0  CL 105 103  CO2  --  24  GLUCOSE 126* 113*  BUN 16 10  CREATININE 0.70 0.71  CALCIUM  --  8.9  MG  --  2.0   Liver Function Tests No results for input(s): AST, ALT, ALKPHOS, BILITOT, PROT, ALBUMIN in the last 72 hours. No results for input(s): LIPASE, AMYLASE in the last 72 hours. Cardiac Enzymes No results for input(s): CKTOTAL,  CKMB, CKMBINDEX, TROPONINI in the last 72 hours. BNP Invalid input(s): POCBNP D-Dimer No results for input(s): DDIMER in the last 72 hours. Hemoglobin A1C No results for input(s): HGBA1C in the last 72 hours. Fasting Lipid Panel No results for input(s): CHOL, HDL, LDLCALC, TRIG, CHOLHDL, LDLDIRECT in the last 72 hours. Thyroid Function Tests No results for input(s): TSH, T4TOTAL, T3FREE, THYROIDAB in the last 72 hours.  Invalid input(s): FREET3 _____________  Dg Chest 2 View  Result Date: 08/30/2018 CLINICAL DATA:  Preoperative chest radiograph prior to aortic valve replacement. EXAM: CHEST - 2 VIEW COMPARISON:  07/12/2018 chest radiograph FINDINGS: Cardiomegaly and chronic peribronchial thickening again noted. Minimal LEFT basilar atelectasis noted. There is no evidence of focal airspace disease, pulmonary edema, suspicious pulmonary nodule/mass, pleural effusion, or pneumothorax. No acute bony abnormalities are identified. IMPRESSION: Cardiomegaly with minimal LEFT basilar atelectasis. Electronically Signed   By: Harmon Pier M.D.   On: 08/30/2018 15:20   Ct Coronary Morph W/cta Cor W/score W/ca W/cm &/or Wo/cm  Addendum Date: 08/05/2018   ADDENDUM REPORT: 08/05/2018 13:39 CLINICAL DATA:  Aortic stenosis EXAM: Cardiac TAVR CT TECHNIQUE: The patient was scanned on a Siemens Force 192 slice scanner. A 120 kV retrospective scan was triggered in the descending thoracic aorta at 111 HU's. Gantry rotation speed was 270 msecs and collimation was .9 mm. No beta blockade or nitro were given. The 3D data set was reconstructed in 5% intervals of the R-R cycle. Systolic and diastolic phases were analyzed on a dedicated work station using MPR, MIP and VRT modes. The patient received 80mL OMNIPAQUE IOHEXOL 350 MG/ML SOLN of contrast. FINDINGS: Aortic Valve: Trileaflet aortic valve with severe leaflet calcifications and thickening, and severely reduced leaflet excursion. AV calcium score: 2731 Aorta: Mild  mixed atherosclerotic plaque in aortic arch. Conventional 3 vessel branch pattern of aortic arch with tortuous arch vessels where visualized proximally. Sinus of Valsalva Measurements: Non-coronary: 32 mm Right - coronary: 29 mm Left - coronary: 31 mm Sinotubular Junction: 27 mm Ascending Thoracic Aorta: 37 mm Aortic Arch: 28 mm Descending Thoracic Aorta: 28 mm Sinus of Valsalva Height: Left: 16 mm Right: 17 mm Coronary Artery Height above Annulus: Left Main: 12 mm Right Coronary: 20 mm Virtual Basal Annulus Measurements: Made at 25% of cardiac cycle due to greater motion artifact at 15 and 20% intervals. Maximum/Minimum Diameter: 31.7 x 22.6 mm Perimeter: 86.3 mm Area:  549 mm2 Based on these measurements, the annulus is best suited to a 29 mm Sapien 3 valve. Coronary Arteries: Lumens not well visualized on today's study. Coronary calcifications present. High anterior takeoff of RCA, normal origins. Optimum Fluoroscopic Angle for Delivery: LAO 0, CAU 0 IMPRESSION: 1. Trileaflet aortic valve with severe leaflet calcifications and thickening, and severely reduced leaflet excursion 2. AV calcium score: 2731 3. Normal caliber aorta. Mild mixed atherosclerotic plaque in aortic arch. Conventional 3 vessel branch pattern of aortic arch with tortuous arch vessels where visualized proximally. 4.  Adequate coronary heights, high anterior takeoff of RCA. 5. Virtual annulus measurements - area: 549 mm2. Based on these measurements, the annulus is best suited to a 29 mm Sapien 3 valve. 6.  Optimum Fluoroscopic Angle for Delivery: LAO 0, CAU 0 Poor image quality degrades the diagnostic quality of this study, though improved over initial nondiagnostic scan. Electronically Signed   By: Weston BrassGayatri  Acharya   On: 08/05/2018 13:39   Result Date: 08/05/2018 EXAM: OVER-READ INTERPRETATION  CT CHEST The following report is an over-read performed by radiologist Dr. Genevive BiStewart Edmunds of [facility] on 08/05/2018. This over-read  does not include interpretation of cardiac or coronary anatomy or pathology. The coronary CTA interpretation by the cardiologist is attached. COMPARISON:  None. FINDINGS: Limited view of the lung parenchyma demonstrates no suspicious nodularity. Airways are normal. Limited view of the mediastinum demonstrates no adenopathy. Esophagus normal. Limited view of the upper abdomen unremarkable. Limited view of the skeleton and chest wall is unremarkable. IMPRESSION: No significant extracardiac findings Electronically Signed: By: Genevive BiStewart  Edmunds M.D. On: 08/05/2018 12:28   Dg Chest Port 1 View  Result Date: 09/03/2018 CLINICAL DATA:  Status post transcatheter aortic valvular replacement. EXAM: PORTABLE CHEST 1 VIEW COMPARISON:  August 30 2018 FINDINGS: The heart size is enlarged. Aortic valvular replacement is identified. The aorta is tortuous. The mediastinal contour is otherwise normal. Right central venous line is identified with distal tip in the superior vena cava. Both lungs are clear. The visualized skeletal structures are unremarkable. IMPRESSION: No active cardiopulmonary disease. Aortic valvular replacement is identified. Heart size is enlarged. Electronically Signed   By: Sherian ReinWei-Chen  Lin M.D.   On: 09/03/2018 15:13   Vas [place] Carotid  Result Date: 08/28/2018 Carotid Arterial Duplex Study Indications:       Pre TAVR. Limitations:       body habitus, thick neck, breathing interference Comparison Study:  no prior Performing Technologist: Jeb LeveringJill Parker RDMS, RVT  Examination Guidelines: A complete evaluation includes B-mode imaging, spectral Doppler, color Doppler, and power Doppler as needed of all accessible portions of each vessel. Bilateral testing is considered an integral part of a complete examination. Limited examinations for reoccurring indications may be performed as noted.  Right Carotid Findings: +----------+--------+--------+--------+------------+--------------+             PSV cm/s EDV  cm/s Stenosis Describe     Comments        +----------+--------+--------+--------+------------+--------------+  CCA Prox   73       14                                             +----------+--------+--------+--------+------------+--------------+  CCA Distal 82       15                                             +----------+--------+--------+--------+------------+--------------+  ICA Prox   90       30       1-39%    heterogenous                 +----------+--------+--------+--------+------------+--------------+  ICA Distal                                         Not visualized  +----------+--------+--------+--------+------------+--------------+  ECA        45       8                                              +----------+--------+--------+--------+------------+--------------+ +----------+--------+-------+--------------+-------------------+             PSV cm/s EDV cms Describe       Arm Pressure (mmHG)  +----------+--------+-------+--------------+-------------------+  Subclavian                  Not identified                      +----------+--------+-------+--------------+-------------------+ +---------+--------+--+--------+-+---------+  Vertebral PSV cm/s 17 EDV cm/s 8 Antegrade  +---------+--------+--+--------+-+---------+  Left Carotid Findings: +----------+--------+--------+--------+------------+--------+             PSV cm/s EDV cm/s Stenosis Describe     Comments  +----------+--------+--------+--------+------------+--------+  CCA Prox   75       19                                       +----------+--------+--------+--------+------------+--------+  CCA Distal 65       30                                       +----------+--------+--------+--------+------------+--------+  ICA Prox   60       18       1-39%    heterogenous           +----------+--------+--------+--------+------------+--------+  ICA Distal 58       19                                        +----------+--------+--------+--------+------------+--------+  ECA        110      22                                       +----------+--------+--------+--------+------------+--------+ +----------+--------+--------+--------------+-------------------+  Subclavian PSV cm/s EDV cm/s Describe       Arm Pressure (mmHG)  +----------+--------+--------+--------------+-------------------+                               Not identified                      +----------+--------+--------+--------------+-------------------+ +---------+--------+--+--------+-+---------+  Vertebral PSV cm/s 27 EDV cm/s 4 Antegrade  +---------+--------+--+--------+-+---------+  Summary: Right Carotid: Velocities in the right  ICA are consistent with a 1-39% stenosis. Left Carotid: Velocities in the left ICA are consistent with a 1-39% stenosis.  *See table(s) above for measurements and observations.  Electronically signed by Gretta Began MD on 08/28/2018 at 6:13:04 PM.    Final    Disposition   Pt is being discharged home today in good condition.  Follow-up Plans & Appointments    Follow-up Information    Janetta Hora, PA-C. Go on 09/11/2018.   Specialties: Cardiology, Radiology Why: @ 3pm, please arrive at the office at least 15 minutes early Contact information: [address] [place] [address] [phone]            Discharge Medications   Allergies as of 09/04/2018   No Known Allergies     Medication List    STOP taking these medications   diclofenac 75 MG EC tablet Commonly known as: VOLTAREN   omeprazole 20 MG capsule Commonly known as: PRILOSEC Replaced by: pantoprazole 40 MG tablet     TAKE these medications   amLODipine 10 MG tablet Commonly known as: NORVASC Take 10 mg by mouth daily.   amoxicillin 500 MG capsule Commonly known as: AMOXIL Take four capsules one hour before dental appointment. What changed:   how much to take  how to take this  when to take  this  additional instructions   aspirin 81 MG chewable tablet Chew 1 tablet (81 mg total) by mouth daily.   Cholecalciferol 125 MCG (5000 UT) Tabs Take 5,000 Units by mouth daily.   clopidogrel 75 MG tablet Commonly known as: PLAVIX Take 1 tablet (75 mg total) by mouth daily with breakfast. Start taking on: September 05, 2018   furosemide 40 MG tablet Commonly known as: LASIX Take 80 mg by mouth daily. Notes to patient: Please take 80 mg twice a day x 3 days. Start taking 80mg  daily again on 8/1   lisinopril 40 MG tablet Commonly known as: ZESTRIL Take 40 mg by mouth daily.   pantoprazole 40 MG tablet Commonly known as: PROTONIX Take 1 tablet (40 mg total) by mouth daily. Start taking on: September 05, 2018 Replaces: omeprazole 20 MG capsule   rosuvastatin 20 MG tablet Commonly known as: CRESTOR Take 20 mg by mouth daily.   vitamin B-12 500 MCG tablet Commonly known as: CYANOCOBALAMIN Take 500 mcg by mouth 2 (two) times a day.         Outstanding Labs/Studies   BMET  Duration of Discharge Encounter   Greater than 30 minutes including physician time.  Byrd Hesselbach, PA-C 09/04/2018, 12:17 PM [phone]  Patient seen, examined. Available data reviewed. Agree with findings, assessment, and plan as outlined by Carlean Jews, PA-C. The patient is stable following TAVR yesterday without CP or dyspnea. On my exam, he is in NAD, lungs with end expiratory wheezing, otherwise clear. CV: RRR with 1/6 SEM at the RUSB, BL groin sites clear. Chronic lymphedema of the left leg unchanged. Echo shows vigorous LV function and normal TAVR valve function with mean gradient 14 mmHg and no PVL. Plan DC home today. Post-TAVR medical program reviewed as above. Anticipate medical therapy for CAD unless anginal symptoms arise. Discussed plan with patient.    Tonny Bollman, M.D. 09/04/2018 10:29 PM

## 2018-09-04 NOTE — Progress Notes (Signed)
Patient given discharge instructions medication list and prescriptions in hand. All questions answered. IV and tele were dcd. Will discharge home as ordered. Tabor Bartram, [name] RN

## 2018-09-04 NOTE — Progress Notes (Signed)
CARDIAC REHAB PHASE I   PRE:  Rate/Rhythm: 83 SR  BP:  Sitting: 126/73      SaO2: 97 RA  Attempted to stand pt with RN for weight. Pt unable to stand due to knee weakness. Reviewed site care, restrictions and HF diet with pt. Pt states ability to transfer safely at home and complete ADLs. Encouraged to continue activity as able with emphasis safety. Pt not appropriate for CRP II at this time.  [id number] Rufina Falco, RN BSN 09/04/2018 9:08 AM

## 2018-09-05 ENCOUNTER — Telehealth: Payer: Self-pay | Admitting: Physician Assistant

## 2018-09-05 NOTE — Telephone Encounter (Signed)
  [place] VALVE TEAM   Patient contacted regarding discharge from [facility] on 09/04/18  Patient understands to follow up with provider [name] on 8/5 at [facility].  Patient understands discharge instructions? yes Patient understands medications and regimen? yes Patient understands to bring all medications to this visit? Yes   We went over him needing to take lasix 80 mg BID x 3 days and then go back to normal dosing of 80mg  daily  Angelena Form PA-C  [facility]

## 2018-09-10 NOTE — Progress Notes (Signed)
[facility] [place]                                       Cardiology Office Note    Date:  09/11/2018   ID:  Syris, Brookens [birth year], MRN [id number]  PCP:  [name], NP  Cardiologist:  Dr. Agustin Cree / Dr. Burt Knack & Dr. Roxy Manns (TAVR)   CC: [facility] s/p TAVR  History of Present Illness:  Marvin Jackson is a 73 y.o. 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   Patient states that he has been told that he has had a heart murmur for at least 6 or 8 years. In the past he was followed by a cardiologist in [place]. In 2016 he suffered a couple of falls at which time he fractured his right upper arm and his right lower leg. Both required surgical treatment. He developed infection in the right lower leg that failed repeat surgical intervention and antibiotic therapy. He eventually was transferred to [facility] where he underwent right below-knee amputation. Prior to amputation he underwent diagnostic cardiac catheterization.He was told that he had both aortic stenosis and multivessel coronary artery disease but neither were severe enough to merit surgical intervention at that time.His recovery following amputation was very slow. He required multiple revisions of his stump which finally healed 3 years later. He eventually was fitted with a prosthesis and he walks very short distances using a walker.For the most part he remains wheelchair-bound.  Approximately 1 year ago the patient moved to [facility] where he now lives with his wife. He was referred to Dr.  Agustin Cree and first evaluated the patient in March 2020. Follow-up echocardiogram July 22, 2018 revealed severe aortic stenosis with preserved left ventricular systolic function and severe AS with mean gradient 40 mmHg.Catheterization revealed severe multivessel coronary artery disease with moderate to severe diffuse disease with long segment 70% stenosis of the pLAD, severe 80% proximal stenosis of the RCA and 80% proximal stenosis of the RI branch. He was evaluated by Dr Roxy Manns, it was felt that based upon coronary anatomy the patient's long-term prognosis would probably be best served with SAVR + CABG. However, he would not consider this patient a candidate for conventional surgery because of his severe comorbid medical problems including previous right below-knee amputation with significant peripheral arterial disease, extremely limited physical mobility and poor functional status, severe morbid obesity, and demonstrated problems with wound healing and recovery from previous surgical operations. Plan was made for medical therapy for CAD and TAVR for severe AS.  He underwent successful TAVR with a 29 mm Edwards Sapien 3 THV via the TF approach on 09/03/18. Post op echo showed EF 65%, mean gradient 14 mm hg and no PVL. He was discharged on POD1. He was felt to have mild volume overload and asked to increase lasix to 80mg  BID x 3 days. He was discharged on aspirin and plavix.  Today he presents to clinic for follow up. No CP or SOB. No LE edema, orthopnea or PND. No dizziness or  syncope. No blood in stool or urine. No palpitations. He has not been doing much of anything. Has not tried to walk or wear his prosthesis.   Past Medical History:  Diagnosis Date  . Anemia 02/02/2015  . Arthritis   . Chronic diastolic heart failure (HCC) 08/29/2015  . Coronary artery disease involving native coronary artery of native heart without angina pectoris    [facility]    . ED (erectile dysfunction) of  organic origin   . Essential hypertension 02/02/2015  . Gastroesophageal reflux disease without esophagitis   . History of total left knee replacement   . Inguinal hernia   . Limited mobility 08/05/2018  . Lymphedema of both lower extremities   . Morbid obesity (HCC)   . Peripheral vascular disease, unspecified (HCC)   . Prediabetes   . Pure hypercholesterolemia 08/29/2015  . 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 Severe aortic stenosis   . Status post below knee amputation of right lower extremity (HCC)   . Vitamin D deficiency disease     Past Surgical History:  Procedure Laterality Date  . BELOW KNEE LEG AMPUTATION Right 2017   [facility]  . HERNIA REPAIR    . JOINT REPLACEMENT    . ORIF HUMERUS FRACTURE Right 2016  . ORIF PROXIMAL TIBIAL PLATEAU FRACTURE Right 2016  . RIGHT/LEFT HEART CATH AND CORONARY ANGIOGRAPHY N/A 07/22/2018   Procedure: RIGHT/LEFT HEART CATH AND CORONARY ANGIOGRAPHY;  Surgeon: Tonny Bollmanooper, Michael, MD;  [facility];  Service: Cardiovascular;  Laterality: N/A;  . TEE WITHOUT CARDIOVERSION N/A 09/03/2018   Procedure: TRANSESOPHAGEAL ECHOCARDIOGRAM (TEE);  Surgeon: Tonny Bollmanooper, Michael, MD;  [facility];  Service: Open Heart Surgery;  Laterality: N/A;  . TOTAL KNEE ARTHROPLASTY Left   . TRANSCATHETER AORTIC VALVE REPLACEMENT, TRANSFEMORAL N/A 09/03/2018   Procedure: TRANSCATHETER AORTIC VALVE REPLACEMENT, TRANSFEMORAL;  Surgeon: Tonny Bollmanooper, Michael, MD;  [facility];  Service: Open Heart Surgery;  Laterality: N/A;    Current Medications: Outpatient Medications Prior to Visit  Medication Sig Dispense Refill  . amLODipine (NORVASC) 10 MG tablet Take 10 mg by mouth daily.    Marland Kitchen. amoxicillin (AMOXIL) 500 MG capsule Take four capsules one hour before dental appointment. 4 capsule 1  . aspirin 81 MG  chewable tablet Chew 1 tablet (81 mg total) by mouth daily. 90 tablet 1  . Cholecalciferol 125 MCG (5000 UT) TABS Take 5,000 Units by mouth daily.    . clopidogrel (PLAVIX) 75 MG tablet Take 1 tablet (75 mg total) by mouth daily with breakfast. 90 tablet 1  . furosemide (LASIX) 40 MG tablet Take 80 mg by mouth daily.     Marland Kitchen. lisinopril (PRINIVIL,ZESTRIL) 40 MG tablet Take 40 mg by mouth daily.    . pantoprazole (PROTONIX) 40 MG tablet Take 1 tablet (40 mg total) by mouth daily. 90 tablet 1  . rosuvastatin (CRESTOR) 20 MG tablet Take 20 mg by mouth daily.     . vitamin B-12 (CYANOCOBALAMIN) 500 MCG tablet Take 500 mcg by mouth 2 (two) times a day.      No facility-administered medications prior to visit.      Allergies:   Patient has no known allergies.   Social History   Socioeconomic History  . Marital status: Married    Spouse name: Not on file  . Number of children:  Not on file  . Years of education: Not on file  . Highest education level: Not on file  Occupational History  . Not on file  Social Needs  . Financial resource strain: Not on file  . Food insecurity    Worry: Not on file    Inability: Not on file  . Transportation needs    Medical: Not on file    Non-medical: Not on file  Tobacco Use  . Smoking status: Never Smoker  . Smokeless tobacco: Former NeurosurgeonUser    Types: Chew, Snuff  Substance and Sexual Activity  . Alcohol use: Not Currently  . Drug use: Never  . Sexual activity: Not on file  Lifestyle  . Physical activity    Days per week: Not on file    Minutes per session: Not on file  . Stress: Not on file  Relationships  . Social Musicianconnections    Talks on phone: Not on file    Gets together: Not on file    Attends religious service: Not on file    Active member of club or organization: Not on file    Attends meetings of clubs or organizations: Not on file    Relationship status: Not on file  Other Topics Concern  . Not on file  Social History Narrative  .  Not on file     Family History:  The patient's family history includes Cancer in his father; Heart disease in his father; Hypertension in his father and mother.      ROS:   Please see the history of present illness.    ROS All other systems reviewed and are negative.   PHYSICAL EXAM:   VS:  BP 112/64   Pulse 77   Ht 6\' 3"  (1.905 m)   Wt (!) 316 lb 3.2 oz (143.4 kg)   SpO2 96%   BMI 39.52 kg/m    GEN: Well nourished, well developed, in no acute distress. obese HEENT: normal Neck: no JVD or masses Cardiac: RRR; soft flow murmur. No rubs, or gallops,no edema  Respiratory:  clear to auscultation bilaterally, normal work of breathing GI: soft, nontender, nondistended, + BS MS: no deformity or atrophy Skin: warm and dry, no rash.  Groin sites clear without hematoma or ecchymosis under pannus Neuro:  Alert and Oriented x 3, Strength and sensation are intact Psych: euthymic mood, full affect   Wt Readings from Last 3 Encounters:  09/11/18 (!) 316 lb 3.2 oz (143.4 kg)  09/04/18 (!) 360 lb 0.2 oz (163.3 kg)  08/30/18 (!) 330 lb (149.7 kg)      Studies/Labs Reviewed:   EKG:  EKG is ordered today.  The ekg ordered today demonstrates NSR with IRBBB HR 77   Recent Labs: 08/30/2018: ALT 13; B Natriuretic Peptide 113.9 09/04/2018: BUN 10; Creatinine, Ser 0.71; Hemoglobin 10.3; Magnesium 2.0; Platelets 262; Potassium 4.0; Sodium 135   Lipid Panel No results found for: CHOL, TRIG, HDL, CHOLHDL, VLDL, LDLCALC, LDLDIRECT  Additional studies/ records that were reviewed today include:  TAVR OPERATIVE NOTE   Date of Procedure:09/03/2018  Preoperative Diagnosis:Severe Aortic Stenosis   Postoperative Diagnosis:Same   Procedure:   Transcatheter Aortic Valve Replacement - PercutaneousRightTransfemoral Approach Edwards Sapien 3 THV (size 29mm, model # 9600TFX, serial [id number])   Co-Surgeons:Clarence H. Cornelius Moraswen, MD and Tonny BollmanMichael Cooper, MD  Anesthesiologist:James Krista BlueSinger, MD  Echocardiographer:Peter Eden EmmsNishan, MD  Pre-operative Echo Findings: ? Severe aortic stenosis ? Normalleft ventricular systolic function  Post-operative Echo Findings: ? Noparavalvular leak ? Normalleft  ventricular systolic function  _____________    Echo 09/04/18: IMPRESSIONS  1. The left ventricle has hyperdynamic systolic function, with an ejection fraction of >65%. The cavity size was normal. severe basal septal hypertrophy. Left ventricular diastolic Doppler parameters are consistent with impaired relaxation.  2. The right ventricle has normal systolic function. The cavity was normal. There is no increase in right ventricular wall thickness.  3. Left atrial size was severely dilated.  4. There is mild mitral annular calcification present.  5. The aorta is normal in size and structure.  6. - TAVR: S/P TAVR well positioned 29 mm Sapien 3 valve. No perivalvular AI. AV Vmax:280.00 cm/s. AV Mean Grad:14.0 mmHg. AV Area (Vmax):2.09 cm. LVOT/AV VTI ratio:0.63.   ASSESSMENT & PLAN:   Severe AS s/p TAVR: doing well. ECG with no HAVB. Groin sites healing well. He has amoxicillin for SBE prophylaxis. Continue aspirin and plavix. I will see him back for 1 month visit and echo later this month.   HTN: Bp well controlled today.  CAD: pre TAVR cath showed severe multivessel coronary artery disease with moderate to severe diffuse disease with long segment 70% stenosis of the pLAD, severe 80% proximal stenosis of the RCA and 80% proximal stenosis of the RI branch. He does not have chest pain. Continue medical therapy with antiplatelets and statin therapy.  Chronic diastolic CHF:  appears evolemic. Wheezing resolved. Continue lasix 80mg  daily. BMET today   Medication Adjustments/Labs and Tests Ordered: Current medicines are reviewed at  length with the patient today.  Concerns regarding medicines are outlined above.  Medication changes, Labs and Tests ordered today are listed in the Patient Instructions below. Patient Instructions  Medication Instructions:  Your physician recommends that you continue on your current medications as directed. Please refer to the Current Medication list given to you today.  If you need a refill on your cardiac medications before your next appointment, please call your pharmacy.   Lab work: BMET today  If you have labs (blood work) drawn today and your tests are completely normal, you will receive your results only by: Marland Kitchen. MyChart Message (if you have MyChart) OR . A paper copy in the mail If you have any lab test that is abnormal or we need to change your treatment, we will call you to review the results.  Testing/Procedures: None  Follow-Up: Keep current follow up appointments for echocardiogram and visit with Carlean JewsKatie Taura Lamarre, PA-C.  Any Other Special Instructions Will Be Listed Below (If Applicable).       Signed, Cline CrockKathryn Sharone Almond, PA-C  09/11/2018 8:18 PM    [facility] [address], [place], [place]  [address] Phone: [phone]) [phone]; Fax: [phone]

## 2018-09-11 ENCOUNTER — Encounter: Payer: Self-pay | Admitting: Physician Assistant

## 2018-09-11 ENCOUNTER — Other Ambulatory Visit: Payer: Self-pay

## 2018-09-11 ENCOUNTER — Ambulatory Visit (INDEPENDENT_AMBULATORY_CARE_PROVIDER_SITE_OTHER): Payer: Medicare PPO | Admitting: Physician Assistant

## 2018-09-11 VITALS — BP 112/64 | HR 77 | Ht 75.0 in | Wt 316.2 lb

## 2018-09-11 DIAGNOSIS — Z952 Presence of prosthetic heart valve: Secondary | ICD-10-CM

## 2018-09-11 DIAGNOSIS — I10 Essential (primary) hypertension: Secondary | ICD-10-CM | POA: Diagnosis not present

## 2018-09-11 DIAGNOSIS — I5032 Chronic diastolic (congestive) heart failure: Secondary | ICD-10-CM

## 2018-09-11 DIAGNOSIS — I2510 Atherosclerotic heart disease of native coronary artery without angina pectoris: Secondary | ICD-10-CM

## 2018-09-11 NOTE — Patient Instructions (Signed)
Medication Instructions:  Your physician recommends that you continue on your current medications as directed. Please refer to the Current Medication list given to you today.  If you need a refill on your cardiac medications before your next appointment, please call your pharmacy.   Lab work: BMET today  If you have labs (blood work) drawn today and your tests are completely normal, you will receive your results only by: Marland Kitchen MyChart Message (if you have MyChart) OR . A paper copy in the mail If you have any lab test that is abnormal or we need to change your treatment, we will call you to review the results.  Testing/Procedures: None  Follow-Up: Keep current follow up appointments for echocardiogram and visit with [name], PA-C.  Any Other Special Instructions Will Be Listed Below (If Applicable).

## 2018-09-12 LAB — BASIC METABOLIC PANEL
BUN/Creatinine Ratio: 16 (ref 10–24)
BUN: 13 mg/dL (ref 8–27)
CO2: 24 mmol/L (ref 20–29)
Calcium: 9.5 mg/dL (ref 8.6–10.2)
Chloride: 102 mmol/L (ref 96–106)
Creatinine, Ser: 0.83 mg/dL (ref 0.76–1.27)
GFR calc Af Amer: 102 mL/min/{1.73_m2} (ref >59)
GFR calc non Af Amer: 89 mL/min/{1.73_m2} (ref >59)
Glucose: 119 mg/dL — ABNORMAL HIGH (ref 65–99)
Potassium: 3.9 mmol/L (ref 3.5–5.2)
Sodium: 142 mmol/L (ref 134–144)

## 2018-09-13 ENCOUNTER — Telehealth: Payer: Self-pay | Admitting: Physician Assistant

## 2018-09-13 NOTE — Telephone Encounter (Signed)
New Message   Patient states that he when he was leaving the office he almost fell trying to get in the car. He said he hurt himself and he is wondering can he get something for the pain. Please call to discuss.

## 2018-09-13 NOTE — Telephone Encounter (Signed)
Pt seen on 8/5 by Marita Snellen, PA. Pt advised to contact PCP to further discuss and be evaluated.  Informed that he will probably need to be seen before they advise on what is needed. Patient verbalized understanding and agreeable to plan.

## 2018-09-24 ENCOUNTER — Ambulatory Visit (HOSPITAL_COMMUNITY): Payer: Medicare PPO | Attending: Cardiology

## 2018-09-24 ENCOUNTER — Other Ambulatory Visit: Payer: Self-pay

## 2018-09-24 DIAGNOSIS — Z952 Presence of prosthetic heart valve: Secondary | ICD-10-CM | POA: Diagnosis present

## 2018-09-25 ENCOUNTER — Telehealth: Payer: Self-pay

## 2018-09-25 ENCOUNTER — Other Ambulatory Visit (HOSPITAL_COMMUNITY): Payer: Medicare PPO

## 2018-09-25 NOTE — Telephone Encounter (Signed)
Called to review medications and verify date and time of virtual visit. Patient was driving so we could not review medications and get verbal consent. Patient could not stay on the phone while driving.

## 2018-09-26 ENCOUNTER — Other Ambulatory Visit: Payer: Self-pay

## 2018-09-26 ENCOUNTER — Telehealth: Payer: Self-pay

## 2018-09-26 ENCOUNTER — Telehealth (INDEPENDENT_AMBULATORY_CARE_PROVIDER_SITE_OTHER): Payer: Medicare PPO | Admitting: Physician Assistant

## 2018-09-26 VITALS — Ht 75.0 in

## 2018-09-26 DIAGNOSIS — Z952 Presence of prosthetic heart valve: Secondary | ICD-10-CM | POA: Diagnosis not present

## 2018-09-26 DIAGNOSIS — I110 Hypertensive heart disease with heart failure: Secondary | ICD-10-CM | POA: Diagnosis not present

## 2018-09-26 DIAGNOSIS — Z7189 Other specified counseling: Secondary | ICD-10-CM

## 2018-09-26 DIAGNOSIS — I10 Essential (primary) hypertension: Secondary | ICD-10-CM

## 2018-09-26 DIAGNOSIS — I2510 Atherosclerotic heart disease of native coronary artery without angina pectoris: Secondary | ICD-10-CM | POA: Diagnosis not present

## 2018-09-26 DIAGNOSIS — I5032 Chronic diastolic (congestive) heart failure: Secondary | ICD-10-CM

## 2018-09-26 NOTE — Telephone Encounter (Signed)
Virtual Visit Pre-Appointment Phone Call Confirm consent - "In the setting of the current Covid19 crisis, you are scheduled for a (phone or video) visit with your provider on (date) at (time).  Just as we do with many in-office visits, in order for you to participate in this visit, we must obtain consent.  If you'd like, I can send this to your mychart (if signed up) or email for you to review.  Otherwise, I can obtain your verbal consent now.  All virtual visits are billed to your insurance company just like a normal visit would be.  By agreeing to a virtual visit, we'd like you to understand that the technology does not allow for your provider to perform an examination, and thus may limit your provider's ability to fully assess your condition. If your provider identifies any concerns that need to be evaluated in person, we will make arrangements to do so.  Finally, though the technology is pretty good, we cannot assure that it will always work on either your or our end, and in the setting of a video visit, we may have to convert it to a phone-only visit.  In either situation, we cannot ensure that we have a secure connection.  Are you willing to proceed?" STAFF: Did the patient verbally acknowledge consent to telehealth visit? Document YES/NO here: YES     TELEPHONE CALL NOTE  Marvin Jackson has been deemed a candidate for a follow-up tele-health visit to limit community exposure during the Covid-19 pandemic. I spoke with the patient via phone to ensure availability of phone/video source, confirm preferred email & phone number, and discuss instructions and expectations.  I reminded Marvin Jackson to be prepared with any vital sign and/or heart rhythm information that could potentially be obtained via home monitoring, at the time of his visit. I reminded Marvin Jackson to expect a phone call prior to his visit.  Marvin Parma, RN 09/26/2018 2:17 PM   INSTRUCTIONS FOR  DOWNLOADING THE MYCHART APP TO SMARTPHONE  - The patient must first make sure to have activated MyChart and know their login information - If Apple, go to [employer] and type in MyChart in the search bar and download the app. If Android, ask patient to go to [employer] and type in [place] in the search bar and download the app. The app is free but as with any other app downloads, their phone may require them to verify saved payment information or Apple/Android password.  - The patient will need to then log into the app with their MyChart username and password, and select [facility] as their healthcare provider to link the account. When it is time for your visit, go to the MyChart app, find appointments, and click Begin Video Visit. Be sure to Select Allow for your device to access the Microphone and Camera for your visit. You will then be connected, and your provider will be with you shortly.  **If they have any issues connecting, or need assistance please contact MyChart service desk [phone])**  **If using a computer, in order to ensure the best quality for their visit they will need to use either of the following Internet Browsers: [employer], or Google Chrome**  IF USING DOXIMITY or DOXY.ME - The patient will receive a link just prior to their visit by text.     FULL LENGTH CONSENT FOR TELE-HEALTH VISIT   I hereby voluntarily request, consent and authorize [place] and  its employed or Journalist, newspaper, Engineer, materials, nurse practitioners or other licensed health care professionals (the Practitioner), to provide me with telemedicine health care services (the "Services") as deemed necessary by the treating Practitioner. I acknowledge and consent to receive the Services by the Practitioner via telemedicine. I understand that the telemedicine visit will involve communicating with the Practitioner through live audiovisual communication technology and the  disclosure of certain medical information by electronic transmission. I acknowledge that I have been given the opportunity to request an in-person assessment or other available alternative prior to the telemedicine visit and am voluntarily participating in the telemedicine visit.  I understand that I have the right to withhold or withdraw my consent to the use of telemedicine in the course of my care at any time, without affecting my right to future care or treatment, and that the Practitioner or I may terminate the telemedicine visit at any time. I understand that I have the right to inspect all information obtained and/or recorded in the course of the telemedicine visit and may receive copies of available information for a reasonable fee.  I understand that some of the potential risks of receiving the Services via telemedicine include:  Marland Kitchen Delay or interruption in medical evaluation due to technological equipment failure or disruption; . Information transmitted may not be sufficient (e.g. poor resolution of images) to allow for appropriate medical decision making by the Practitioner; and/or  . In rare instances, security protocols could fail, causing a breach of personal health information.  Furthermore, I acknowledge that it is my responsibility to provide information about my medical history, conditions and care that is complete and accurate to the best of my ability. I acknowledge that Practitioner's advice, recommendations, and/or decision may be based on factors not within their control, such as incomplete or inaccurate data provided by me or distortions of diagnostic images or specimens that may result from electronic transmissions. I understand that the practice of medicine is not an exact science and that Practitioner makes no warranties or guarantees regarding treatment outcomes. I acknowledge that I will receive a copy of this consent concurrently upon execution via email to the email address I  last provided but may also request a printed copy by calling the office of [place].    I understand that my insurance will be billed for this visit.   I have read or had this consent read to me. . I understand the contents of this consent, which adequately explains the benefits and risks of the Services being provided via telemedicine.  . I have been provided ample opportunity to ask questions regarding this consent and the Services and have had my questions answered to my satisfaction. . I give my informed consent for the services to be provided through the use of telemedicine in my medical care  By participating in this telemedicine visit I agree to the above.

## 2018-09-26 NOTE — Progress Notes (Signed)
[facility]   MULTIDISCIPLINARY HEART VALVE TEAM  Virtual Visit via Telephone Note   This visit type was conducted due to national recommendations for restrictions regarding the COVID-19 Pandemic (e.g. social distancing) in an effort to limit this patient's exposure and mitigate transmission in our community.  Due to his co-morbid illnesses, this patient is at least at moderate risk for complications without adequate follow up.  This format is felt to be most appropriate for this patient at this time.  The patient did not have access to video technology/had technical difficulties with video requiring transitioning to audio format only (telephone).  All issues noted in this document were discussed and addressed.  No physical exam could be performed with this format.  Please refer to the patient's chart for his  consent to telehealth for [facility].   Evaluation Performed:  Follow-up visit  Date:  09/26/2018   ID:  Marvin Jackson, DOB August 16, 1945, MRN [id number]  Patient Location: Home Provider Location: Office  PCP:  Elie GoodyBryan, Taressa, NP  Cardiologist: Dr. Bing MatterKrasowski / Dr. Excell Seltzerooper & Dr. Cornelius Moraswen (TAVR)  Chief Complaint:  1 month s/p TAVR   History of Present Illness:    [name] is a 73 y.o. 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   Patient states that he has been told that he has had a heart murmur for at least 6 or 8 years. In the past he was followed by a cardiologist in [place]. In 2016 he suffered a couple of falls at which time he fractured his right upper arm and his right lower leg. Both required surgical  treatment. He developed infection in the right lower leg that failed repeat surgical intervention and antibiotic therapy. [facility] where he underwent right below-knee amputation. Prior to amputation he underwent diagnostic cardiac catheterization.He was told that he had both aortic stenosis and multivessel coronary artery disease but neither were severe enough to merit surgical intervention at that time.His recovery following amputation was very slow. He required multiple revisions of his stump which finally healed 3 years later. He eventually was fitted with a prosthesis and he walks very short distances using a walker.For the most part he remains wheelchair-bound.  Approximately 1 year ago [place] he now lives with his wife. He was referred to Dr. Bing MatterKrasowski and first evaluated the patient in March 2020. Follow-up echocardiogram July 22, 2018 revealed severe aortic stenosis with preserved left ventricular systolic function and severe AS with mean gradient 40 mmHg.Catheterization revealed severe multivessel coronary artery disease with moderate to severe diffuse disease with long segment 70% stenosis of the pLAD, severe 80% proximal stenosis of the RCA and 80% proximal stenosis of the RI branch.He was evaluated by Dr Cornelius Moraswen, it was felt that based upon coronary anatomy the patient's long-term prognosis would probably be best served with SAVR + CABG. However,hewould not consider this patient a candidate for conventional surgery because of his severe comorbid medical problems including previous right below-knee amputation with significant peripheral arterial disease, extremely limited physical mobility and poor functional status, severe morbid obesity, and demonstrated problems with wound healing and recovery from  previous surgical operations. Plan was made for medical therapy for CAD and TAVR for severe  AS.  He underwent successful TAVR with a9229mm Edwards Sapien 3 THV via the TF approach on 09/03/18. Post op echo showed EF 65%, mean gradient 14 mm hg and no PVL. He was discharged on POD1. He was felt to have mild volume overload and asked to increase lasix to 80mg  BID x 3 days. He was discharged on aspirin and plavix.  The patient does not have symptoms concerning for COVID-19 infection (fever, chills, cough, or new shortness of breath).   Today he presents for follow up. No CP or SOB. No LE edema, orthopnea or PND. No dizziness or syncope. No blood in stool or urine. No palpitations. His right groin site has been slow to heal underneath this pannus. He had a little bit of blood on his underwear the other day.     Past Medical History:  Diagnosis Date   Anemia 02/02/2015   Arthritis    Chronic diastolic heart failure (HCC) 08/29/2015   Coronary artery disease involving native coronary artery of native heart without angina pectoris    [facility]     ED (erectile dysfunction) of organic origin    Essential hypertension 02/02/2015   Gastroesophageal reflux disease without esophagitis    History of total left knee replacement    Inguinal hernia    Limited mobility 08/05/2018   Lymphedema of both lower extremities    Morbid obesity (HCC)    Peripheral vascular disease, unspecified (HCC)    Prediabetes    Pure hypercholesterolemia 08/29/2015   S/P TAVR (transcatheter aortic valve replacement) 09/03/2018   29 mm Edwards Sapien 3 Ultra transcatheter heart valve placed via percutaneous right transfemoral approach    Severe aortic stenosis    Status post below knee amputation of right lower extremity (HCC)    Vitamin D deficiency disease    Past Surgical History:  Procedure Laterality Date   BELOW KNEE LEG [facility]UTATION Right 2017   [facility]   HERNIA REPAIR     JOINT REPLACEMENT     ORIF HUMERUS FRACTURE Right 2016   ORIF PROXIMAL TIBIAL PLATEAU FRACTURE  Right 2016   RIGHT/LEFT HEART CATH AND CORONARY ANGIOGRAPHY N/A 07/22/2018   Procedure: RIGHT/LEFT HEART CATH AND CORONARY ANGIOGRAPHY;  Surgeon: Tonny Bollmanooper, Michael, MD;  [facility];  Service: Cardiovascular;  Laterality: N/A;   TEE WITHOUT CARDIOVERSION N/A 09/03/2018   Procedure: TRANSESOPHAGEAL ECHOCARDIOGRAM (TEE);  Surgeon: Tonny Bollmanooper, Michael, MD;  [facility];  Service: Open Heart Surgery;  Laterality: N/A;   TOTAL KNEE ARTHROPLASTY Left    TRANSCATHETER AORTIC VALVE REPLACEMENT, TRANSFEMORAL N/A 09/03/2018   Procedure: TRANSCATHETER AORTIC VALVE REPLACEMENT, TRANSFEMORAL;  Surgeon: Tonny Bollmanooper, Michael, MD;  [facility];  Service: Open Heart Surgery;  Laterality: N/A;     Current Meds  Medication Sig   amLODipine (NORVASC) 10 MG tablet Take 10 mg by mouth daily.   amoxicillin (AMOXIL) 500 MG capsule Take four capsules one hour before dental appointment.   aspirin 81 MG chewable tablet Chew 1 tablet (81 mg total) by mouth daily.   Cholecalciferol 125 MCG (5000 UT) TABS Take 5,000 Units by mouth daily.   clopidogrel (PLAVIX) 75 MG tablet Take 1 tablet (75 mg total) by mouth daily with breakfast.   furosemide (LASIX) 40 MG tablet Take 80 mg by mouth daily.    lisinopril (PRINIVIL,ZESTRIL) 40 MG tablet Take 40 mg by mouth  daily.   pantoprazole (PROTONIX) 40 MG tablet Take 1 tablet (40 mg total) by mouth daily.   rosuvastatin (CRESTOR) 20 MG tablet Take 20 mg by mouth daily.    vitamin B-12 (CYANOCOBALAMIN) 500 MCG tablet Take 500 mcg by mouth 2 (two) times a day.      Allergies:   Patient has no known allergies.   Social History   Tobacco Use   Smoking status: Never Smoker   Smokeless tobacco: Former NeurosurgeonUser    Types: Chew, Snuff  Substance Use Topics   Alcohol use: Not Currently   Drug use: Never     Family Hx: The patient's family history includes Cancer in his father; Heart disease in his father; Hypertension in his  father and mother.  ROS:   Please see the history of present illness.    All other systems reviewed and are negative.   Prior CV studies:   The following studies were reviewed today:  TAVR OPERATIVE NOTE   Date of Procedure:09/03/2018  Preoperative Diagnosis:Severe Aortic Stenosis   Postoperative Diagnosis:Same   Procedure:   Transcatheter Aortic Valve Replacement - PercutaneousRightTransfemoral Approach Edwards Sapien 3 THV (size 29mm, model # 9600TFX, serial [id number])  Co-Surgeons:Clarence H. Cornelius Moraswen, MD and Tonny BollmanMichael Cooper, MD  Anesthesiologist:James Krista BlueSinger, MD  Echocardiographer:Peter Eden EmmsNishan, MD  Pre-operative Echo Findings: ? Severe aortic stenosis ? Normalleft ventricular systolic function  Post-operative Echo Findings: ? Noparavalvular leak ? Normalleft ventricular systolic function  _____________   Echo 09/04/18: IMPRESSIONS 1. The left ventricle has hyperdynamic systolic function, with an ejection fraction of >65%. The cavity size was normal. severe basal septal hypertrophy. Left ventricular diastolic Doppler parameters are consistent with impaired relaxation. 2. The right ventricle has normal systolic function. The cavity was normal. There is no increase in right ventricular wall thickness. 3. Left atrial size was severely dilated. 4. There is mild mitral annular calcification present. 5. The aorta is normal in size and structure. 6. - TAVR: S/P TAVR well positioned 29 mm Sapien 3 valve. No perivalvular AI. AV Vmax:280.00 cm/s. AV Mean Grad:14.0 mmHg. AV Area (Vmax):2.09 cm. LVOT/AV VTI ratio:0.63.  _____________   Echo 09/24/18 IMPRESSIONS  1. The left ventricle has hyperdynamic systolic function, with an ejection fraction of >65%. The cavity size was normal. There is moderately increased left ventricular wall  thickness. Left ventricular diastolic Doppler parameters are consistent with impaired relaxation. No evidence of left ventricular regional wall motion abnormalities.  2. The right ventricle has normal systolic function. The cavity was normal. There is no increase in right ventricular wall thickness.  3. The aorta is normal unless otherwise noted.  4. - TAVR: S/P 29mm Edwards Sapien TAVR which appears to be functioning normally. The mean AVG is 11.407mmHg, Vmax 238cm/s, dimensionless index 0.67. There is no perivalular leak.   Labs/Other Tests and Data Reviewed:    EKG:  No ECG reviewed.  Recent Labs: 08/30/2018: ALT 13; B Natriuretic Peptide 113.9 09/04/2018: Hemoglobin 10.3; Magnesium 2.0; Platelets 262 09/11/2018: BUN 13; Creatinine, Ser 0.83; Potassium 3.9; Sodium 142   Recent Lipid Panel No results found for: CHOL, TRIG, HDL, CHOLHDL, LDLCALC, LDLDIRECT  Wt Readings from Last 3 Encounters:  09/11/18 (!) 316 lb 3.2 oz (143.4 kg)  09/04/18 (!) 360 lb 0.2 oz (163.3 kg)  08/30/18 (!) 330 lb (149.7 kg)     Objective:    Vital Signs:  Ht 6\' 3"  (1.905 m)    BMI 39.52 kg/m    ASSESSMENT & PLAN:    Severe AS s/p  TAVR: echo today 09/25/18 showed EF >65%, normally functioning TAVR with a mean gradient of 11 mm Hg and no PVL. He has NYHA class I symptoms, but is essentially wheelchair bound. SBE prophylaxis discussed; he has amoxixillin. Plavix can be discontinued after 6 months of therapy. I will see him back in 1 year with echo. Of note, his wife describes his right groin site as slow to heal. No bleeding or drainage but will not scab up. I think this is due to the location of the incision under his large pannus. I have asked them to continue to monitor, dress with a dry bandage and gauze and to call me if he develops a foul odor, erythema or drainage.   HTN: he was not able to take his BP for [place] today. Continue current regimen   CAD: pre TAVR cath showed severe multivessel coronary artery  disease with moderate to severe diffuse disease with long segment 70% stenosis of the pLAD, severe 80% proximal stenosis of the RCA and 80% proximal stenosis of the RI branch. He does not have chest pain. Continue medical therapy with antiplatelets and statin therapy.  Chronic diastolic CHF: no s/s CHF. Continue current diuretic regimen   Poor dentition: he had a dental extraction of one tooth prior to valve surgery. I have asked him to establish care with a local dentist.   COVID-19 Education: The signs and symptoms of COVID-19 were discussed with the patient and how to seek care for testing (follow up with PCP or arrange E-visit).  The importance of social distancing was discussed today.  Time:   Today, I have spent 20 minutes with the patient with telehealth technology discussing the above problems.     Medication Adjustments/Labs and Tests Ordered: Current medicines are reviewed at length with the patient today.  Concerns regarding medicines are outlined above.   Tests Ordered: Orders Placed This Encounter  Procedures   ECHOCARDIOGRAM COMPLETE    Medication Changes: No orders of the defined types were placed in this encounter.   Disposition:  Follow up in 1 year(s) with me and 3 months with Dr. Agustin Cree   Signed, Angelena Form, PA-C  09/26/2018 3:01 PM    [place]

## 2018-09-26 NOTE — Patient Instructions (Signed)
Medication Instructions:  1) You may STOP PLAVIX when you run out of refills (this should be around the end of January 2021)  Follow-Up: You will be called to arrange a 3 month appointment with Dr. Agustin Cree.  You are scheduled for your 1 year TAVR appointments on August 28, 2019. Please arrive by 12:35 PM.

## 2019-02-13 ENCOUNTER — Other Ambulatory Visit: Payer: Self-pay | Admitting: Physician Assistant

## 2019-03-01 ENCOUNTER — Other Ambulatory Visit: Payer: Self-pay | Admitting: Physician Assistant

## 2019-03-26 DIAGNOSIS — Z0000 Encounter for general adult medical examination without abnormal findings: Secondary | ICD-10-CM | POA: Insufficient documentation

## 2019-03-26 HISTORY — DX: Encounter for general adult medical examination without abnormal findings: Z00.00

## 2019-07-15 ENCOUNTER — Other Ambulatory Visit: Payer: Self-pay

## 2019-07-15 ENCOUNTER — Ambulatory Visit (INDEPENDENT_AMBULATORY_CARE_PROVIDER_SITE_OTHER): Payer: Medicare PPO | Admitting: Podiatry

## 2019-07-15 DIAGNOSIS — Z5329 Procedure and treatment not carried out because of patient's decision for other reasons: Secondary | ICD-10-CM

## 2019-07-15 NOTE — Progress Notes (Signed)
No show for appt. 

## 2019-07-24 ENCOUNTER — Ambulatory Visit: Payer: Medicare PPO | Admitting: Podiatry

## 2019-07-24 ENCOUNTER — Encounter: Payer: Self-pay | Admitting: Podiatry

## 2019-07-24 ENCOUNTER — Other Ambulatory Visit: Payer: Self-pay

## 2019-07-24 DIAGNOSIS — S88111A Complete traumatic amputation at level between knee and ankle, right lower leg, initial encounter: Secondary | ICD-10-CM

## 2019-07-24 DIAGNOSIS — B351 Tinea unguium: Secondary | ICD-10-CM | POA: Diagnosis not present

## 2019-07-24 DIAGNOSIS — M79676 Pain in unspecified toe(s): Secondary | ICD-10-CM | POA: Diagnosis not present

## 2019-07-24 NOTE — Patient Instructions (Addendum)
Edema  Edema is when you have too much fluid in your body or under your skin. Edema may make your legs, feet, and ankles swell up. Swelling is also common in looser tissues, like around your eyes. This is a common condition. It gets more common as you get older. There are many possible causes of edema. Eating too much salt (sodium) and being on your feet or sitting for a long time can cause edema in your legs, feet, and ankles. Hot weather may make edema worse. Edema is usually painless. Your skin may look swollen or shiny. Follow these instructions at home:  Keep the swollen body part raised (elevated) above the level of your heart when you are sitting or lying down.  Do not sit still or stand for a long time.  Do not wear tight clothes. Do not wear garters on your upper legs.  Exercise your legs. This can help the swelling go down.  Wear elastic bandages or support stockings as told by your doctor.  Eat a low-salt (low-sodium) diet to reduce fluid as told by your doctor.  Depending on the cause of your swelling, you may need to limit how much fluid you drink (fluid restriction).  Take over-the-counter and prescription medicines only as told by your doctor. Contact a doctor if:  Treatment is not working.  You have heart, liver, or kidney disease and have symptoms of edema.  You have sudden and unexplained weight gain. Get help right away if:  You have shortness of breath or chest pain.  You cannot breathe when you lie down.  You have pain, redness, or warmth in the swollen areas.  You have heart, liver, or kidney disease and get edema all of a sudden.  You have a fever and your symptoms get worse all of a sudden. Summary  Edema is when you have too much fluid in your body or under your skin.  Edema may make your legs, feet, and ankles swell up. Swelling is also common in looser tissues, like around your eyes.  Raise (elevate) the swollen body part above the level  of your heart when you are sitting or lying down.  Follow your doctor's instructions about diet and how much fluid you can drink (fluid restriction). This information is not intended to replace advice given to you by your health care provider. Make sure you discuss any questions you have with your health care provider. Document Revised: 01/26/2017 Document Reviewed: 02/11/2016 Elsevier Patient Education  2020 Else[employer] Inc. Onychomycosis/Fungal Toenails  WHAT IS IT? An infection that lies within the keratin of your nail plate that is caused by a fungus.  WHY ME? Fungal infections affect all ages, sexes, races, and creeds.  There may be many factors that predispose you to a fungal infection such as age, coexisting medical conditions such as diabetes, or an autoimmune disease; stress, medications, fatigue, genetics, etc.  Bottom line: fungus thrives in a warm, moist environment and your shoes offer such a location.  IS IT CONTAGIOUS? Theoretically, yes.  You do not want to share shoes, nail clippers or files with someone who has fungal toenails.  Walking around barefoot in the same room or sleeping in the same bed is unlikely to transfer the organism.  It is important to realize, however, that fungus can spread easily from one nail to the next on the same foot.  HOW DO WE TREAT THIS?  There are several ways to treat this condition.  Treatment may depend  on many factors such as age, medications, pregnancy, liver and kidney conditions, etc.  It is best to ask your doctor which options are available to you.  9. No treatment.   Unlike many other medical concerns, you can live with this condition.  However for many people this can be a painful condition and may lead to ingrown toenails or a bacterial infection.  It is recommended that you keep the nails cut short to help reduce the amount of fungal nail. 10. Topical treatment.  These range from herbal remedies to prescription strength nail lacquers.  About  40-50% effective, topicals require twice daily application for approximately 9 to 12 months or until an entirely new nail has grown out.  The most effective topicals are medical grade medications available through physicians offices. 11. Oral antifungal medications.  With an 80-90% cure rate, the most common oral medication requires 3 to 4 months of therapy and stays in your system for a year as the new nail grows out.  Oral antifungal medications do require blood work to make sure it is a safe drug for you.  A liver function panel will be performed prior to starting the medication and after the first month of treatment.  It is important to have the blood work performed to avoid any harmful side effects.  In general, this medication safe but blood work is required. 12. Laser Therapy.  This treatment is performed by applying a specialized laser to the affected nail plate.  This therapy is noninvasive, fast, and non-painful.  It is not covered by insurance and is therefore, out of pocket.  The results have been very good with a 80-95% cure rate.  The [facility] is the only practice in the area to offer this therapy. 13. Permanent Nail Avulsion.  Removing the entire nail so that a new nail will not grow back.

## 2019-07-24 NOTE — Progress Notes (Signed)
Subjective: Marvin Jackson presents today referred by Marvin Range, NP with cc of painful, discolored, thick toenails which interfere with daily activities.  Pain is aggravated when wearing enclosed shoe gear. Pt states it's been over a year since he's had his toenails trimmed. Due to limited mobility and mostly being wheelchair bound, he cannot cut his toenails.  He states he is not diabetic, though chart indicates dx of prediabetes. He is not following a diabetic diet, takes no oral medications nor does he take insulin.  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  Today, he is in his wheelchair. He uses a walker when he wears his right LE prosthesis.  Past Medical History:  Diagnosis Date  . Anemia 02/02/2015  . Arthritis   . Chronic diastolic heart failure ([place]) 08/29/2015  . Coronary artery disease involving native coronary artery of native heart without angina pectoris    Heart Cath 02/2015 at [facility]    . ED (erectile dysfunction) of organic origin   . Essential hypertension 02/02/2015  . Gastroesophageal reflux disease without esophagitis   . History of total left knee replacement   . Inguinal hernia   . Limited mobility 08/05/2018  . Lymphedema of both lower extremities   . Morbid obesity ([place])   . Peripheral vascular disease, unspecified ([place])   . Prediabetes   . Pure hypercholesterolemia 08/29/2015  . 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 Severe aortic stenosis   . Status post below knee amputation of right lower extremity ([place])   . Vitamin D deficiency disease      Patient Active Problem List   Diagnosis Date Noted  . Onychomycosis 07/25/2019  . Encounter for annual wellness exam in Medicare patient 03/26/2019  . Hx of right BKA ([place]) 09/03/2018  . Acute  on chronic diastolic heart failure ([place]) 09/03/2018  . 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 Limited mobility 08/05/2018  . Morbid obesity ([place])   . Critical aortic valve stenosis 07/10/2018  . Coronary artery disease involving native coronary artery of native heart without angina pectoris   . ED (erectile dysfunction) of organic origin   . Essential hypertension   . Gastroesophageal reflux disease without esophagitis   . 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 Peripheral vascular disease, unspecified ([place])   . Prediabetes   . Vitamin D deficiency disease   . BMI 40.0-44.9, adult ([place]) 07/17/2017  . Pneumococcal vaccine refused 08/28/2016  . 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 Pure hypercholesterolemia 08/29/2015  . Amputation of right lower extremity below knee ([place]) 08/29/2015  . Anemia 02/02/2015  . Nonhealing surgical wound, sequela 11/05/2014     Past Surgical History:  Procedure Laterality Date  . BELOW KNEE LEG AMPUTATION Right 2017   [facility]  . HERNIA REPAIR    . JOINT REPLACEMENT    . ORIF HUMERUS FRACTURE Right 2016  . ORIF PROXIMAL TIBIAL PLATEAU FRACTURE Right 2016  . RIGHT/LEFT HEART CATH AND CORONARY ANGIOGRAPHY N/A 07/22/2018   Procedure: RIGHT/LEFT HEART CATH AND CORONARY ANGIOGRAPHY;  Surgeon: Sherren Mocha, MD;  Location: [facility] CV LAB;  Service: Cardiovascular;  Laterality: N/A;  . TEE WITHOUT CARDIOVERSION N/A 09/03/2018   Procedure: TRANSESOPHAGEAL ECHOCARDIOGRAM (TEE);  Surgeon: Sherren Mocha, MD;  Location: [facility]  INVASIVE CV LAB;  Service: Open Heart Surgery;  Laterality: N/A;  . TOTAL KNEE ARTHROPLASTY Left   . TRANSCATHETER AORTIC VALVE REPLACEMENT, TRANSFEMORAL N/A 09/03/2018   Procedure: TRANSCATHETER AORTIC VALVE REPLACEMENT, TRANSFEMORAL;  Surgeon: Tonny Bollman, MD;  Location: [facility] INVASIVE CV LAB;  Service: Open Heart Surgery;  Laterality: N/A;     Current Outpatient  Medications on File Prior to Visit  Medication Sig Dispense Refill  . Multiple Vitamins-Minerals (VITAMIN D3 COMPLETE PO) Take by mouth.    Marland Kitchen amLODipine (NORVASC) 10 MG tablet Take 10 mg by mouth daily.    . Cholecalciferol 125 MCG (5000 UT) TABS Take 5,000 Units by mouth daily.    . clopidogrel (PLAVIX) 75 MG tablet TAKE 1 TABLET BY MOUTH EVERY DAY WITH BREAKFAST 90 tablet 2  . CVS ASPIRIN ADULT LOW DOSE 81 MG chewable tablet CHEW 1 TABLET BY MOUTH EVERY DAY 90 tablet 2  . docusate sodium (COLACE) 50 MG capsule Take by mouth.    . finasteride (PROSCAR) 5 MG tablet     . furosemide (LASIX) 40 MG tablet Take 80 mg by mouth daily.     Marland Kitchen lisinopril (PRINIVIL,ZESTRIL) 40 MG tablet Take 40 mg by mouth daily.    . naproxen (NAPROSYN) 500 MG tablet Take by mouth.    . pantoprazole (PROTONIX) 40 MG tablet TAKE 1 TABLET BY MOUTH EVERY DAY 90 tablet 2  . Potassium Chloride ER 20 MEQ TBCR     . rosuvastatin (CRESTOR) 20 MG tablet Take 20 mg by mouth daily.     . tamsulosin (FLOMAX) 0.4 MG CAPS capsule TAKE 1 CAPSULE BY MOUTH EVERY DAY    . vitamin B-12 (CYANOCOBALAMIN) 500 MCG tablet Take 500 mcg by mouth 2 (two) times a day.      No current facility-administered medications on file prior to visit.     No Known Allergies   Social History   Occupational History  . Not on file  Tobacco Use  . Smoking status: Never Smoker  . Smokeless tobacco: Former Neurosurgeon    Types: Chew, Snuff  Vaping Use  . Vaping Use: Never used  Substance and Sexual Activity  . Alcohol use: Not Currently  . Drug use: Never  . Sexual activity: Not on file     Family History  Problem Relation Age of Onset  . Hypertension Mother   . Hypertension Father   . Heart disease Father   . Cancer Father      Immunization History  Administered Date(s) Administered  . Moderna SARS-COVID-2 Vaccination 03/13/2019, 04/10/2019     Review of systems: Positive Findings in bold print.  Constitutional:  chills, fatigue, fever,  sweats, weight change Communication: Nurse, learning disability, sign Presenter, broadcasting, hand writing, iPad/Android device Head: headaches, head injury Eyes: changes in vision, eye pain, glaucoma, cataracts, macular degeneration, diplopia, glare,  light sensitivity, eyeglasses or contacts, blindness Ears nose mouth throat: hearing impaired, hearing aids,  ringing in ears, deaf, sign language,  vertigo,   nosebleeds,  rhinitis,  cold sores, snoring, swollen glands Cardiovascular: HTN, edema, arrhythmia, pacemaker in place, defibrillator in place, chest pain/tightness, chronic anticoagulation, blood clot, heart failure, MI Peripheral Vascular: leg cramps, varicose veins, blood clots, lymphedema, varicosities Respiratory:  difficulty breathing, denies congestion, SOB, wheezing, cough, emphysema Gastrointestinal: change in appetite or weight, abdominal pain, constipation, diarrhea, nausea, vomiting, vomiting blood, change in bowel habits, abdominal pain, jaundice, rectal bleeding, hemorrhoids, GERD Genitourinary:  nocturia,  pain on urination, polyuria,  blood in urine, Foley catheter, urinary urgency,  ESRD on hemodialysis Musculoskeletal: amputation, cramping, stiff joints, painful joints, decreased joint motion, fractures, OA, gout, hemiplegia, paraplegia, uses cane, wheelchair bound, uses walker, uses rollator Skin: +changes in toenails, color change, dryness, itching, mole changes,  rash, wound(s) Neurological: headaches, numbness in feet, paresthesias in feet, burning in feet, fainting,  seizures, change in speech. denies headaches, memory problems/poor historian, cerebral palsy, weakness, paralysis, CVA, TIA Endocrine: diabetes, hypothyroidism, hyperthyroidism,  goiter, dry mouth, flushing, heat intolerance,  cold intolerance,  excessive thirst, denies polyuria,  nocturia Hematological:  easy bleeding, excessive bleeding, easy bruising, enlarged lymph nodes, on long term blood thinner, history of past  transusions Allergy/immunological:  hives, eczema, frequent infections, multiple drug allergies, seasonal allergies, transplant recipient, multiple food allergies Psychiatric:  anxiety, depression, mood disorder, suicidal ideations, hallucinations, insomnia  Objective: There were no vitals filed for this visit.  74 y.o. pleasant African American male, wheelchair bound, morbidly obese, in NAD. AAO X 3.  Vascular Examination: Nonpalpable DP pulse(s) left lower extremity. Nonpalpable PT pulse(s) left lower extremity. Pedal hair absent LLE. Skin temperature gradient WNL LLE. No pain with calf compression LLE. Unilateral edema left LE.  Dermatological Examination: Toenails 1-5 left elongated, discolored, dystrophic, thickened, and crumbly with subungual debris and tenderness to dorsal palpation.  Musculoskeletal Examination: Normal muscle strength 5/5 to all lower extremity muscle groups bilaterally. Muscle strength 3/5 to all LE muscle groups of left lower extremity. Utilizes wheelchair for mobility assistance.  Neurological Examination: Protective sensation diminished with 10 gram monofilament left lower extremity. Vibratory sensation diminished left lower extremity.  Assessment: 1. Onychomycosis   2. Amputation of right lower extremity below knee (HCC)    Plan: -Examined patient. -Toenails 1-5 left debrided in length and girth without iatrogenic bleeding with sterile nail nipper and dremel.  -Patient to report any pedal injuries to medical professional immediately. -Patient to continue soft, supportive shoe gear daily. -Patient/POA to call should there be question/concern in the interim.  Return in about 3 months (around 10/24/2019).

## 2019-07-25 DIAGNOSIS — B351 Tinea unguium: Secondary | ICD-10-CM | POA: Insufficient documentation

## 2019-08-28 ENCOUNTER — Ambulatory Visit: Payer: Medicare PPO | Admitting: Physician Assistant

## 2019-08-28 ENCOUNTER — Other Ambulatory Visit (HOSPITAL_COMMUNITY): Payer: Medicare PPO

## 2019-10-06 ENCOUNTER — Other Ambulatory Visit (HOSPITAL_COMMUNITY): Payer: Medicare PPO

## 2019-10-06 ENCOUNTER — Ambulatory Visit: Payer: Medicare PPO | Admitting: Physician Assistant

## 2019-10-07 ENCOUNTER — Other Ambulatory Visit (HOSPITAL_COMMUNITY): Payer: Medicare PPO

## 2019-10-21 ENCOUNTER — Ambulatory Visit: Payer: Medicare PPO | Admitting: Physician Assistant

## 2019-10-21 ENCOUNTER — Other Ambulatory Visit (HOSPITAL_COMMUNITY): Payer: Medicare PPO

## 2019-11-06 ENCOUNTER — Ambulatory Visit: Payer: Medicare PPO | Admitting: Podiatry

## 2019-11-11 NOTE — Progress Notes (Signed)
Cardiology Office Note  Date:  11/12/2019   ID:  Marvin, Jackson [birth year], MRN [id number]  PCP:  Elie Goody, NP  Cardiologist:  Dr. Lattie Corns  _____________  1 year TAVR f/u  _____________   History of Present Illness: Marvin Jackson is a 74 y.o. male with pmh of morbid obesity, multivessel CAD, chronic diastolic CHF, HTN, HLD, borderline DM2, GERD, arthirtis s/p L TKA, PAD s/p R BKA, no nunion of the right UE following surgical repair of proximal humerus fracture, chronic lymphedema, severely limited physical mobility, and severe AS s/p TAVR 09/03/18/2020 who presents today for cardiology evaluation.  Cath prior to TAVR showed severe multivessel CAD with moderate to diffuse disease with long segment 70% stenosis of the pLAD, severe 80% proximal stenosis of thr RCA and 80% proximal stenosis of the RI branch.   The patient was last seen 09/26/18 via telemedicine by the structural heart team. He previously underwent successful TAVR with a 64mm Edwards Sapein 3 THV via the TF approach. Echo showed EF >65% with normally functioning TAVR with a mean gradient of . Pt was essentially wheelchair bound. Plan was for 1 year follow-up.   Today, he is being seen by General cardiology in place of the structural heart team due to difficulty scheduling. He says overall he is doing great. No chest pain or breathing issues. Has chronic lower leg swelling which is unchanged. Weights are generally stable around 330lbs. He takes lasix daily. He tries to follow low satl diet. He denies any bleeding issues with aspirin and plavix. He is wheelchair bound and is able to transfer himself. He lives with his wife. He can perform all ADLs. He sid he was just seen by PCP who got blood work. He denies symptoms of palpitations, orthopnea, PND, claudication, dizziness, presyncope, syncope, bleeding, or neurologic sequela. The patient is tolerating medications without difficulties and is  otherwise without complaint today.   _____________   Past Medical History:  Diagnosis Date  . Anemia 02/02/2015  . Arthritis   . Chronic diastolic heart failure (HCC) 08/29/2015  . Coronary artery disease involving native coronary artery of native heart without angina pectoris    Heart Cath 02/2015 at [facility]    . ED (erectile dysfunction) of organic origin   . Essential hypertension 02/02/2015  . Gastroesophageal reflux disease without esophagitis   . History of total left knee replacement   . Inguinal hernia   . Limited mobility 08/05/2018  . Lymphedema of both lower extremities   . Morbid obesity (HCC)   . Peripheral vascular disease, unspecified (HCC)   . Prediabetes   . Pure hypercholesterolemia 08/29/2015  . 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 Severe aortic stenosis   . Status post below knee amputation of right lower extremity (HCC)   . Vitamin D deficiency disease    Past Surgical History:  Procedure Laterality Date  . BELOW KNEE LEG AMPUTATION Right 2017   [facility]  . HERNIA REPAIR    . JOINT REPLACEMENT    . ORIF HUMERUS FRACTURE Right 2016  . ORIF PROXIMAL TIBIAL PLATEAU FRACTURE Right 2016  . RIGHT/LEFT HEART CATH AND CORONARY ANGIOGRAPHY N/A 07/22/2018   Procedure: RIGHT/LEFT HEART CATH AND CORONARY ANGIOGRAPHY;  Surgeon: Tonny Bollman, MD;  Location: [facility] INVASIVE CV LAB;  Service: Cardiovascular;  Laterality: N/A;  . TEE WITHOUT CARDIOVERSION N/A 09/03/2018  Procedure: TRANSESOPHAGEAL ECHOCARDIOGRAM (TEE);  Surgeon: Tonny Bollman, MD;  Location: [facility] INVASIVE CV LAB;  Service: Open Heart Surgery;  Laterality: N/A;  . TOTAL KNEE ARTHROPLASTY Left   . TRANSCATHETER AORTIC VALVE REPLACEMENT, TRANSFEMORAL N/A 09/03/2018   Procedure: TRANSCATHETER AORTIC VALVE REPLACEMENT, TRANSFEMORAL;  Surgeon: Tonny Bollman, MD;  Location: [facility] INVASIVE  CV LAB;  Service: Open Heart Surgery;  Laterality: N/A;   _____________  Current Outpatient Medications  Medication Sig Dispense Refill  . amLODipine (NORVASC) 10 MG tablet Take 10 mg by mouth daily.    . Cholecalciferol 125 MCG (5000 UT) TABS Take 5,000 Units by mouth daily.    . clopidogrel (PLAVIX) 75 MG tablet TAKE 1 TABLET BY MOUTH EVERY DAY WITH BREAKFAST 90 tablet 2  . CVS ASPIRIN ADULT LOW DOSE 81 MG chewable tablet CHEW 1 TABLET BY MOUTH EVERY DAY 90 tablet 2  . D-Mannose 500 MG CAPS Take by mouth 2 (two) times daily.    Marland Kitchen docusate sodium (COLACE) 50 MG capsule Take by mouth.    . finasteride (PROSCAR) 5 MG tablet     . furosemide (LASIX) 40 MG tablet Take 80 mg by mouth daily.     Marland Kitchen lisinopril (PRINIVIL,ZESTRIL) 40 MG tablet Take 40 mg by mouth daily.    . naproxen (NAPROSYN) 500 MG tablet Take by mouth.    . pantoprazole (PROTONIX) 40 MG tablet TAKE 1 TABLET BY MOUTH EVERY DAY 90 tablet 2  . Potassium Chloride ER 20 MEQ TBCR     . rosuvastatin (CRESTOR) 20 MG tablet Take 20 mg by mouth daily.     . tamsulosin (FLOMAX) 0.4 MG CAPS capsule TAKE 1 CAPSULE BY MOUTH EVERY DAY    . vitamin B-12 (CYANOCOBALAMIN) 500 MCG tablet Take 500 mcg by mouth 2 (two) times a day.      No current facility-administered medications for this visit.   _____________   Allergies:   Patient has no known allergies.  _____________   Social History:  The patient  reports that he has never smoked. He has quit using smokeless tobacco.  His smokeless tobacco use included chew and snuff. He reports previous alcohol use. He reports that he does not use drugs.  _____________   Family History:  The patient's family history includes Cancer in his father; Heart disease in his father; Hypertension in his father and mother.  _____________   ROS:  Please see the history of present illness.   Positive for chronic lower leg edema,   All other systems are reviewed and negative.  _____________   PHYSICAL  EXAM: VS:  BP 118/72   Pulse 61   Ht 6\' 3"  (1.905 m)   Wt (!) 340 lb (154.2 kg)   SpO2 97%   BMI 42.50 kg/m  , BMI Body mass index is 42.5 kg/m. GEN: Well nourished, well developed, in no acute distress  HEENT: normal  Neck: no JVD, carotid bruits, or masses Cardiac: RRR; + murmurs, rubs, or gallops. No clubbing, cyanosis, mild Left leg edema.  R BKA Respiratory:  clear to auscultation bilaterally, normal work of breathing GI: soft, nontender, nondistended, + BS MS: no deformity or atrophy  Skin: warm and dry, no rash Neuro:  Strength and sensation are intact Psych: euthymic mood, full affect _____________  EKG:   The ekg ordered today shows NSR 61bpm, 1st degree AV block, nonspecific ST changes  Recent Labs: No results found for requested labs within last 8760 hours.  No results found for requested labs  within last 8760 hours.  CrCl cannot be calculated (Patient's most recent lab result is older than the maximum 21 days allowed.).  Wt Readings from Last 3 Encounters:  11/12/19 (!) 340 lb (154.2 kg)  09/11/18 (!) 316 lb 3.2 oz (143.4 kg)  09/04/18 (!) 360 lb 0.2 oz (163.3 kg)    _____________   ASSESSMENT AND PLAN:  Severe AS s/p TAVR 08/2018/Chronic diastolic CHF - S/p successful TAVR with a 65mm Edwards Sapein 3 THV via the TF approach. Echo showed EF >65% with normally functioning TAVR with a mean gradient of . - Patient had 1 yr echo today. Reviewed with Cline Crock. PA-C. Mean gradient and pump function is normal - Patient takes lasix daily. Weights are stable around 330lbs. Follows low salt diet. He denies sob, chest pain, orthopnea, PND. HE is wheelchair bound since he is s/p R BKA which limits certain activities.  - QBH41 completed - NHYC class 1  HTN - BP today 118/72 - continue lisinopril - Will request labs from PCP  CAD - cath prior to TAVR showed severe multivessel CAD with moderate to diffuse disease with long segment 70% stenosis of  the pLAD, severe 80% proximal stenosis of thr RCA and 80% proximal stenosis of the RI branch. - Denies chest pain - continue statin and antiplatelet therapy. No bleeding issues.   HLD - continue statin - Will request labs from PCP   Disposition:   FU with MD in 1 year   Signed, Rion Schnitzer David Stall, PA-C 11/12/2019 3:22 PM    _____________ [facility] [address] [place] [address]  [phone] (office) [phone] (fax)

## 2019-11-12 ENCOUNTER — Other Ambulatory Visit: Payer: Self-pay

## 2019-11-12 ENCOUNTER — Encounter: Payer: Self-pay | Admitting: Physician Assistant

## 2019-11-12 ENCOUNTER — Ambulatory Visit (HOSPITAL_COMMUNITY): Payer: Medicare PPO | Attending: Cardiovascular Disease

## 2019-11-12 ENCOUNTER — Ambulatory Visit (INDEPENDENT_AMBULATORY_CARE_PROVIDER_SITE_OTHER): Payer: Medicare PPO | Admitting: Medical

## 2019-11-12 VITALS — BP 118/72 | HR 61 | Ht 75.0 in | Wt 340.0 lb

## 2019-11-12 DIAGNOSIS — I10 Essential (primary) hypertension: Secondary | ICD-10-CM | POA: Diagnosis not present

## 2019-11-12 DIAGNOSIS — Z952 Presence of prosthetic heart valve: Secondary | ICD-10-CM | POA: Insufficient documentation

## 2019-11-12 DIAGNOSIS — I5032 Chronic diastolic (congestive) heart failure: Secondary | ICD-10-CM

## 2019-11-12 LAB — ECHOCARDIOGRAM COMPLETE
AR max vel: 1.4 cm2
AV Area VTI: 1.98 cm2
AV Area mean vel: 1.86 cm2
AV Mean grad: 14 mmHg
AV Peak grad: 32 mmHg
Ao pk vel: 2.83 m/s
Area-P 1/2: 2.55 cm2
S' Lateral: 3.5 cm

## 2019-11-12 NOTE — Patient Instructions (Signed)
Medication Instructions:  Your physician recommends that you continue on your current medications as directed. Please refer to the Current Medication list given to you today.  *If you need a refill on your cardiac medications before your next appointment, please call your pharmacy*  Lab Work: None ordered today  Testing/Procedures: None ordered today  Follow-Up: At [facility], you and your health needs are our priority.  As part of our continuing mission to provide you with exceptional heart care, we have created designated Provider Care Teams.  These Care Teams include your primary Cardiologist (physician) and Advanced Practice Providers (APPs -  Physician Assistants and Nurse Practitioners) who all work together to provide you with the care you need, when you need it.  Your next appointment:   6 month(s)  The format for your next appointment:   In Person  Provider:   Robert Krasowski, MD   

## 2019-11-19 ENCOUNTER — Other Ambulatory Visit: Payer: Self-pay | Admitting: Physician Assistant

## 2019-11-21 ENCOUNTER — Other Ambulatory Visit: Payer: Self-pay | Admitting: Physician Assistant

## 2019-11-21 NOTE — Telephone Encounter (Signed)
Called in to the Pharmacy  

## 2019-12-04 ENCOUNTER — Telehealth: Payer: Self-pay | Admitting: *Deleted

## 2019-12-04 NOTE — Telephone Encounter (Signed)
   [facility] Pre-operative Risk Assessment    Request for surgical clearance:  1. What type of surgery is being performed? Colonoscopy   2. When is this surgery scheduled? 12/09/2019   3. What type of clearance is required (medical clearance vs. Pharmacy clearance to hold med vs. Both)? Both 4.   5. Are there any medications that need to be held prior to surgery and how long?Plavix and Aspirin   6. Practice name and name of physician performing surgery? [facility], Dr. Nehemiah Settle   7. What is your office phone number [phone]    7.   What is your office fax number [phone]  8.   Anesthesia type (None, local, MAC, general) ? Propofol sedation   Marvin Jackson 12/04/2019, 4:17 PM  _________________________________________________________________   (provider comments below)

## 2019-12-05 NOTE — Telephone Encounter (Signed)
° °  Primary Cardiologist: Dr. Excell Seltzer  Chart reviewed as part of pre-operative protocol coverage.   Hey Dr. Excell Seltzer - wanted to touch base on this patient who underwent TAVR 08/2018. Prior to TAVR he had a LHC which revealed moderate-severe multivessel CAD, though was ultimately turned down for CABG+SAVR in favor of TAVR and medically managed CAD.   We received a request for preop evaluation for upcoming colonoscopy. Can you comment on recommendations for holding aspirin and plavix?   Please route your response back to P CV DIV PREOP.   Thank you!  Beatriz Stallion, PA-C 12/05/2019, 5:30 PM

## 2019-12-06 NOTE — Telephone Encounter (Signed)
Pt cleared to hold ASA and plavix 5 days prior to colonoscopy. thanks

## 2019-12-08 NOTE — Telephone Encounter (Signed)
Please verify the date of surgery. If the surgery is indeed tomorrow, then there may not be enough hold time for the medications.

## 2019-12-09 NOTE — Telephone Encounter (Signed)
   Primary Cardiologist: Dr. Bing Matter Dr. Excell Seltzer  Chart reviewed as part of pre-operative protocol coverage. Patient was contacted 12/09/2019 in reference to pre-operative risk assessment for pending surgery as outlined below.  Mayfield Schoene was last seen on 11/12/2019 by Cadence Furth.  Since that day, Nicola Quesnell has done well without chest pain or significant dyspnea.  Therefore, based on ACC/AHA guidelines, the patient would be at acceptable risk for the planned procedure without further cardiovascular testing.   The patient was advised that if he develops new symptoms prior to surgery to contact our office to arrange for a follow-up visit, and he verbalized understanding.  I will route this recommendation to the requesting party via Epic fax function and remove from pre-op pool. Please call with questions.  Per Dr. Excell Seltzer, ok to hold aspirin and plavix for 5 days prior to the procedure and restart as soon as possible after the procedure at the surgeon's discretion.   [place], [place] 12/09/2019, 2:29 PM

## 2019-12-09 NOTE — Telephone Encounter (Signed)
Called the requesting office to asked about the date of the surgery. The procedure date is 12/17/19. I informed the staff that patient is to hold ASA and Plavix for 5 days prior to procedure. I was thanked for calling and asked to fax over the clearance. Confirmed fax number and I do not have to make it to the attention to anyone.

## 2019-12-09 NOTE — Telephone Encounter (Signed)
Faxed clearance via Epic to [facility]. Will call to verify fax was received.

## 2019-12-13 ENCOUNTER — Other Ambulatory Visit: Payer: Self-pay | Admitting: Physician Assistant

## 2019-12-15 NOTE — Telephone Encounter (Signed)
Pt's pharmacy is requesting a refill on pantoprazole. Would Dr. Cooper like to refill this medication? Please address 

## 2020-07-13 DIAGNOSIS — E261 Secondary hyperaldosteronism: Secondary | ICD-10-CM | POA: Insufficient documentation

## 2020-08-31 DIAGNOSIS — R531 Weakness: Secondary | ICD-10-CM

## 2020-08-31 HISTORY — DX: Weakness: R53.1

## 2020-09-11 IMAGING — CT CT HEAR MORPH WITH CTA COR WITH SCORE WITH CA WITH CONTRAST AND
2 of 7 series · 11 of 20 positions shown, 13 images · IV contrast (APPLIED)
Comparison: None.
COMPARISON: None.

Addendum:
EXAM:
OVER-READ INTERPRETATION  CT CHEST

The following report is an over-read performed by radiologist Dr.
Katsushige Rasika [REDACTED] on 08/05/2018. This
over-read does not include interpretation of cardiac or coronary
anatomy or pathology. The coronary CTA interpretation by the
cardiologist is attached.
CLINICAL DATA: Aortic stenosis
Cardiac TAVR CT
TECHNIQUE: The patient was scanned on a Siemens Force [REDACTED]ice scanner. A 120
kV retrospective scan was triggered in the descending thoracic aorta
at 111 HU's. Gantry rotation speed was 270 msecs and collimation was
.9 mm. No beta blockade or nitro were given. The 3D data set was
reconstructed in 5% intervals of the R-R cycle. Systolic and
diastolic phases were analyzed on a dedicated work station using
MPR, MIP and VRT modes. The patient received 80mL OMNIPAQUE IOHEXOL
350 MG/ML SOLN of contrast.

[Series 11: 0-90% · axial · 0.51mm/px · z∈[+1221,+1343]mm · 5 of 3060 slices shown]
[im 510/3060  vessel]
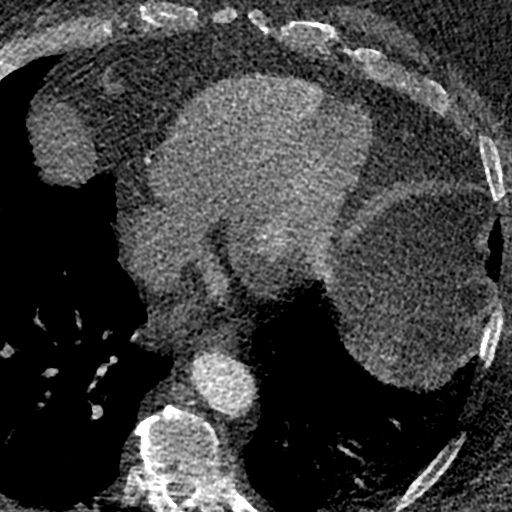
[im 1020/3060  vessel]
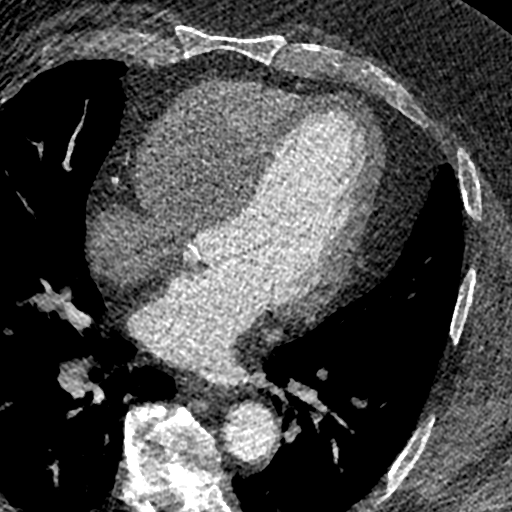
[im 1530/3060  vessel]
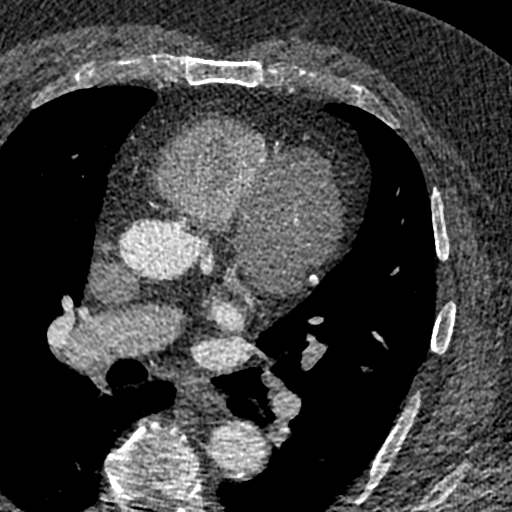
[im 2040/3060  vessel]
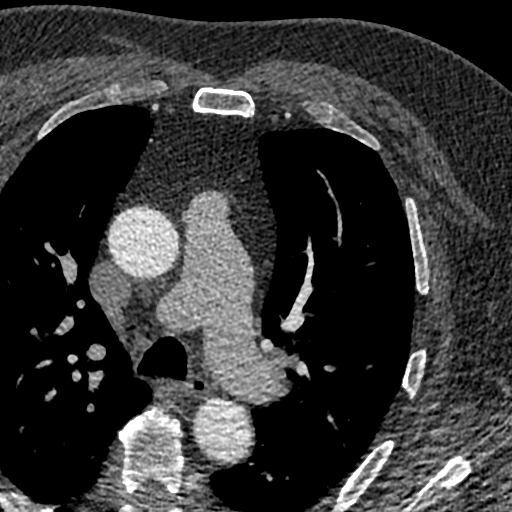
[im 2550/3060  vessel]
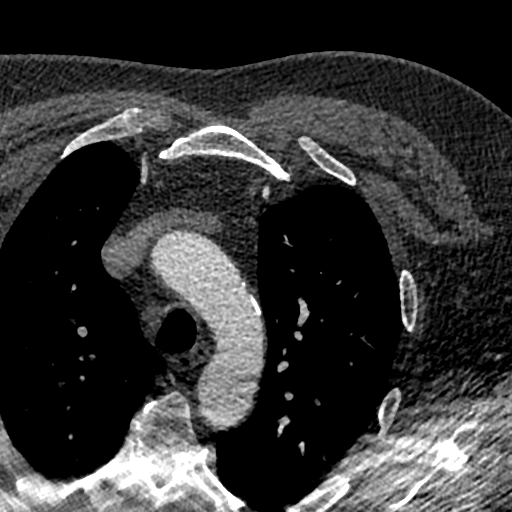

[Series 12: 5-95% · axial · 0.51mm/px · z∈[+1217,+1348]mm · 6 of 3060 slices shown, 8 images]
[im 438/3060  vessel]
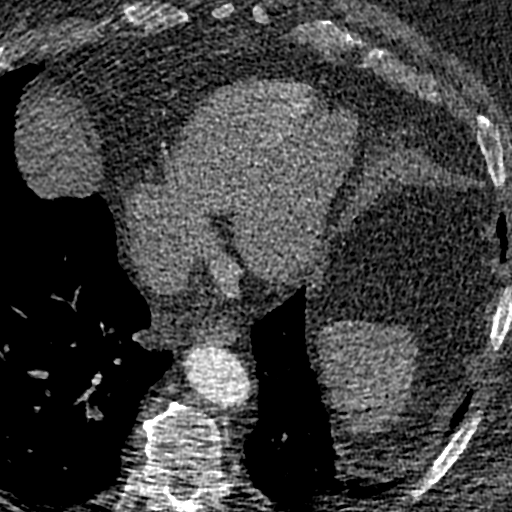
[im 438/3060  lung]
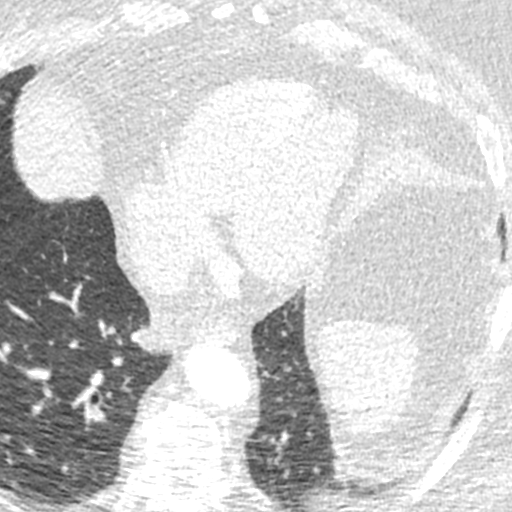
[im 875/3060  vessel]
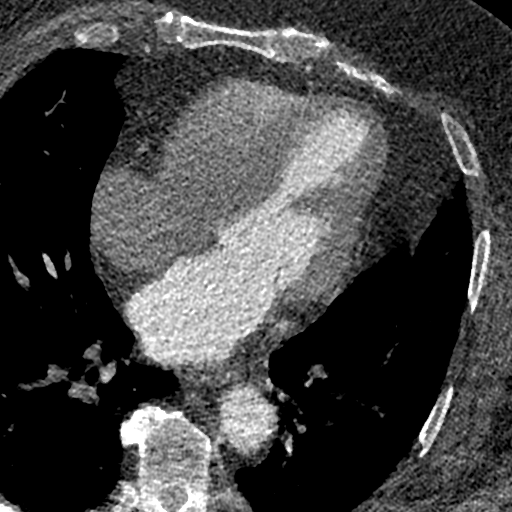
[im 1312/3060  vessel]
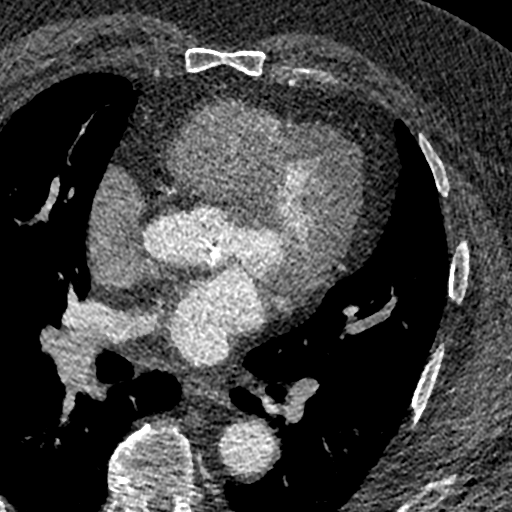
[im 1749/3060  vessel]
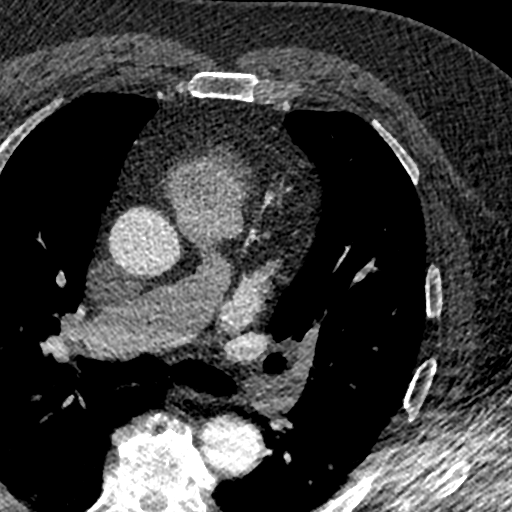
[im 2186/3060  vessel]
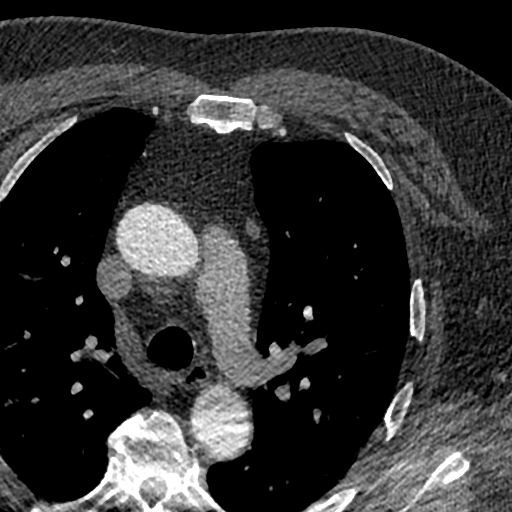
[im 2186/3060  lung]
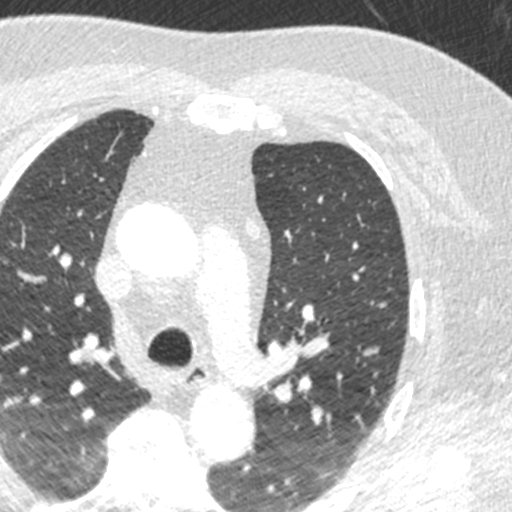
[im 2623/3060  vessel]
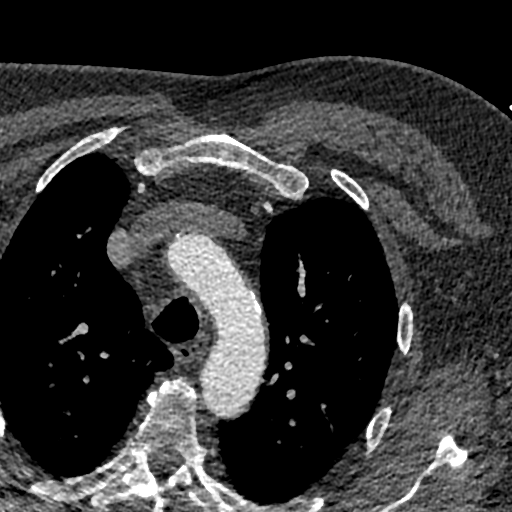

[11 of 20 positions shown; findings below may reference images not displayed]

FINDINGS: Limited view of the lung parenchyma demonstrates no suspicious
nodularity. Airways are normal.

Limited view of the mediastinum demonstrates no adenopathy.
Esophagus normal.

Limited view of the upper abdomen unremarkable.

Limited view of the skeleton and chest wall is unremarkable.
IMPRESSION: No significant extracardiac findings
FINDINGS: Aortic Valve: Trileaflet aortic valve with severe leaflet
calcifications and thickening, and severely reduced leaflet
excursion.

AV calcium score: 3045

Aorta: Mild mixed atherosclerotic plaque in aortic arch.
Conventional 3 vessel branch pattern of aortic arch with tortuous
arch vessels where visualized proximally.

Sinus of Valsalva Measurements:

Non-coronary: 32 mm

Right - coronary: 29 mm

Left - coronary: 31 mm

Sinotubular Junction: 27 mm

Ascending Thoracic Aorta: 37 mm

Aortic Arch: 28 mm

Descending Thoracic Aorta: 28 mm

Sinus of Valsalva Height:

Left: 16 mm

Right: 17 mm

Coronary Artery Height above Annulus:

Left Main: 12 mm

Right Coronary: 20 mm

Virtual Basal Annulus Measurements: Made at 25% of cardiac cycle due
to greater motion artifact at 15 and 20% intervals.

Maximum/Minimum Diameter: 31.7 x 22.6 mm

Perimeter: 86.3 mm

Area: 549 mm2

Based on these measurements, the annulus is best suited to a 29 mm
Sapien 3 valve.

Coronary Arteries: Lumens not well visualized on today's study.
Coronary calcifications present. High anterior takeoff of RCA,
normal origins.

Optimum Fluoroscopic Angle for Delivery: LAO 0, JIM 0
IMPRESSION: 1. Trileaflet aortic valve with severe leaflet calcifications and
thickening, and severely reduced leaflet excursion

2. AV calcium score: 3045

3. Normal caliber aorta. Mild mixed atherosclerotic plaque in aortic
arch. Conventional 3 vessel branch pattern of aortic arch with
tortuous arch vessels where visualized proximally.

4.  Adequate coronary heights, high anterior takeoff of RCA.

5. Virtual annulus measurements - area: 549 mm2. Based on these
measurements, the annulus is best suited to a 29 mm Sapien 3 valve.

6.  Optimum Fluoroscopic Angle for Delivery: LAO 0, JIM 0

Poor image quality degrades the diagnostic quality of this study,
though improved over initial nondiagnostic scan.

*** End of Addendum ***
EXAM:
OVER-READ INTERPRETATION  CT CHEST

The following report is an over-read performed by radiologist Dr.
Katsushige Rasika [REDACTED] on 08/05/2018. This
over-read does not include interpretation of cardiac or coronary
anatomy or pathology. The coronary CTA interpretation by the
cardiologist is attached.
FINDINGS: Limited view of the lung parenchyma demonstrates no suspicious
nodularity. Airways are normal.

Limited view of the mediastinum demonstrates no adenopathy.
Esophagus normal.

Limited view of the upper abdomen unremarkable.

Limited view of the skeleton and chest wall is unremarkable.
IMPRESSION: No significant extracardiac findings

## 2020-09-13 ENCOUNTER — Other Ambulatory Visit: Payer: Self-pay | Admitting: Cardiology

## 2020-10-10 IMAGING — DX PORTABLE CHEST - 1 VIEW
1 series · 1 of 1 positions shown · non-contrast
Comparison: August 30, 2018

CLINICAL DATA: Status post transcatheter aortic valvular
replacement.

EXAM:
PORTABLE CHEST 1 VIEW

[chest]
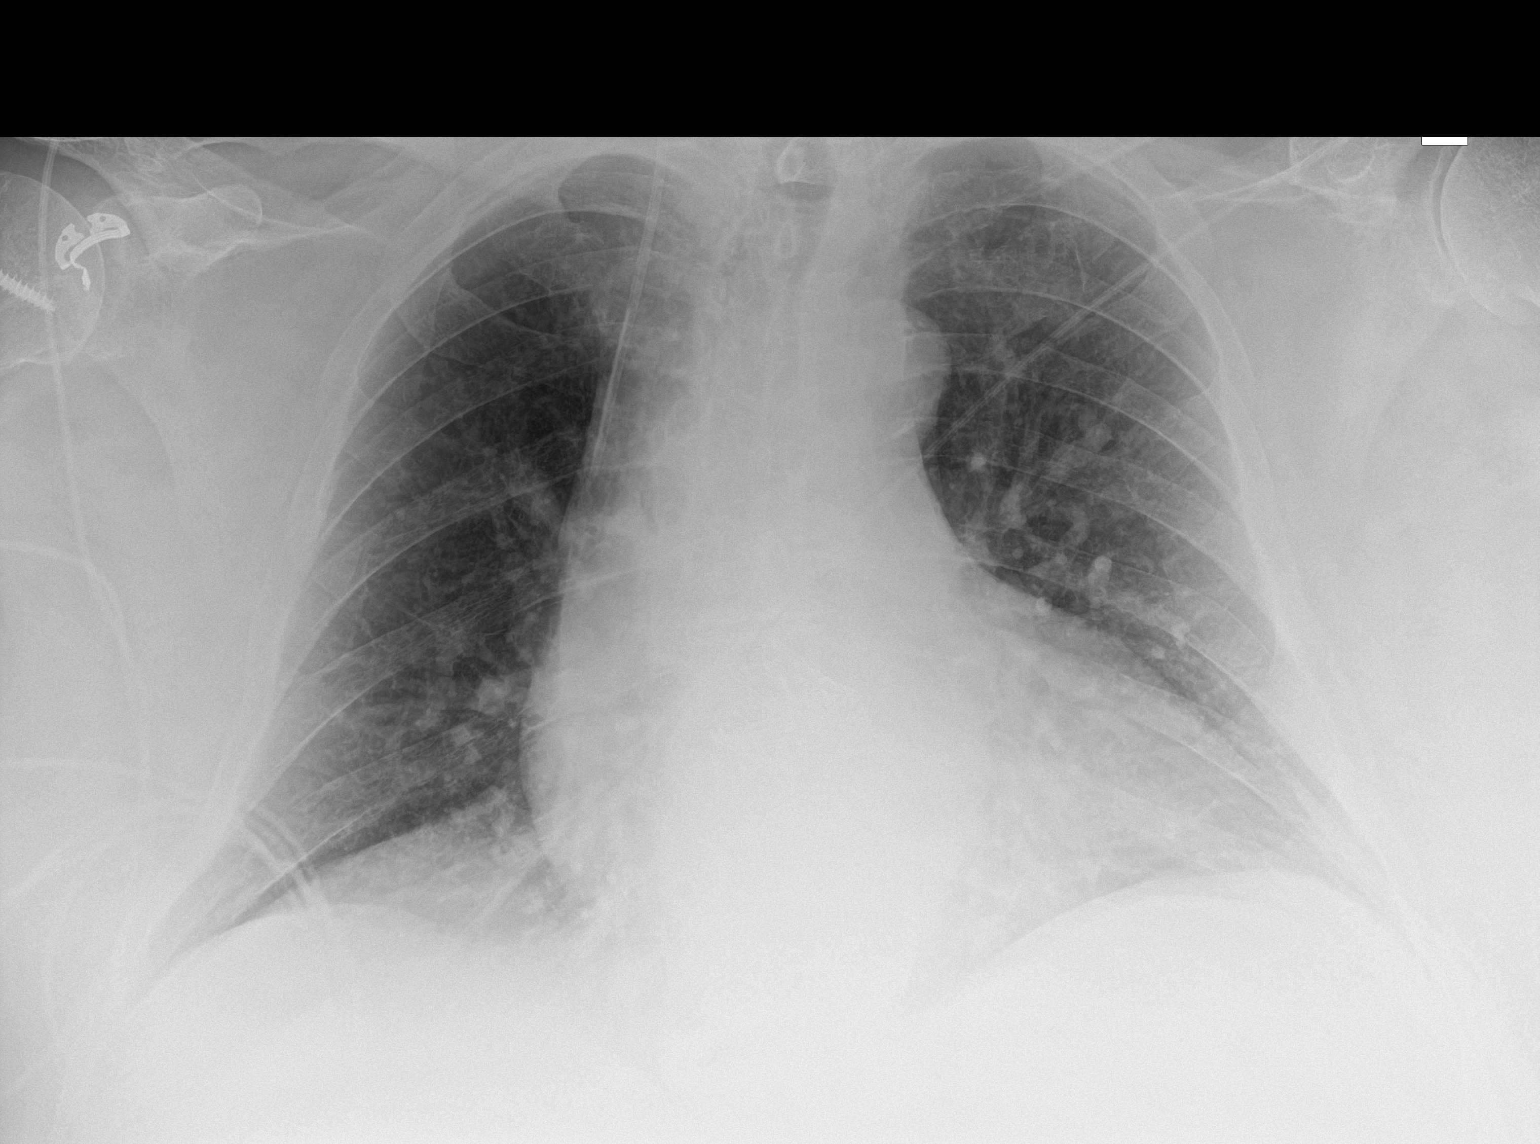

[1 of 1 positions shown; findings below may reference images not displayed]

FINDINGS: The heart size is enlarged. Aortic valvular replacement is
identified. The aorta is tortuous. The mediastinal contour is
otherwise normal. Right central venous line is identified with
distal tip in the superior vena cava. Both lungs are clear. The
visualized skeletal structures are unremarkable.
IMPRESSION: No active cardiopulmonary disease. Aortic valvular replacement is
identified. Heart size is enlarged.

## 2020-10-13 ENCOUNTER — Other Ambulatory Visit: Payer: Self-pay | Admitting: Cardiovascular Disease

## 2020-10-13 ENCOUNTER — Other Ambulatory Visit: Payer: Self-pay | Admitting: Cardiology

## 2020-10-13 NOTE — Telephone Encounter (Signed)
Aspirin Adult low dose 81 mg chewable # 90 only , with message patient needs appointment for further refills. Sent to  [id number] - [place], [place] - [address]

## 2021-01-08 ENCOUNTER — Other Ambulatory Visit: Payer: Self-pay | Admitting: Cardiology

## 2021-02-09 ENCOUNTER — Other Ambulatory Visit: Payer: Self-pay | Admitting: Physician Assistant

## 2021-02-10 ENCOUNTER — Other Ambulatory Visit: Payer: Self-pay | Admitting: Cardiovascular Disease

## 2021-02-13 ENCOUNTER — Other Ambulatory Visit: Payer: Self-pay | Admitting: Cardiovascular Disease

## 2021-03-08 ENCOUNTER — Other Ambulatory Visit: Payer: Self-pay | Admitting: Cardiovascular Disease

## 2021-03-26 ENCOUNTER — Other Ambulatory Visit: Payer: Self-pay | Admitting: Cardiovascular Disease

## 2021-04-07 ENCOUNTER — Other Ambulatory Visit: Payer: Self-pay | Admitting: Cardiology

## 2021-04-11 ENCOUNTER — Other Ambulatory Visit: Payer: Self-pay | Admitting: Cardiovascular Disease

## 2021-04-12 ENCOUNTER — Telehealth: Payer: Self-pay | Admitting: Cardiology

## 2021-04-12 NOTE — Telephone Encounter (Signed)
Pt needs refills/scheduled an overdue follow up with Bing Matter ? ?Please advise [phone] ? ?Thank you! ? ?

## 2021-04-13 MED ORDER — PANTOPRAZOLE SODIUM 40 MG PO TBEC
40.0000 mg | DELAYED_RELEASE_TABLET | Freq: Every day | ORAL | 0 refills | Status: DC
Start: 2021-04-13 — End: 2021-05-12

## 2021-04-13 MED ORDER — CLOPIDOGREL BISULFATE 75 MG PO TABS: 75.0000 mg | ORAL_TABLET | Freq: Every day | ORAL | 0 refills | Status: DC

## 2021-04-13 NOTE — Telephone Encounter (Signed)
Spoke with patient and informed him that he must keep appointment on 05/10/21 with Dr. Bing Matter.  I told him I would give him enough medication to get him through until then. He thanked me for the call. ?

## 2021-05-10 ENCOUNTER — Encounter: Payer: Self-pay | Admitting: Cardiology

## 2021-05-10 ENCOUNTER — Ambulatory Visit: Payer: Medicare Other | Admitting: Cardiology

## 2021-05-10 VITALS — BP 134/76 | HR 5 | Ht 75.0 in | Wt 335.0 lb

## 2021-05-10 DIAGNOSIS — I10 Essential (primary) hypertension: Secondary | ICD-10-CM | POA: Diagnosis not present

## 2021-05-10 DIAGNOSIS — S88111A Complete traumatic amputation at level between knee and ankle, right lower leg, initial encounter: Secondary | ICD-10-CM

## 2021-05-10 DIAGNOSIS — I2510 Atherosclerotic heart disease of native coronary artery without angina pectoris: Secondary | ICD-10-CM

## 2021-05-10 DIAGNOSIS — Z952 Presence of prosthetic heart valve: Secondary | ICD-10-CM

## 2021-05-10 DIAGNOSIS — Z89511 Acquired absence of right leg below knee: Secondary | ICD-10-CM

## 2021-05-10 NOTE — Patient Instructions (Signed)
Medication Instructions:  ?Your physician recommends that you continue on your current medications as directed. Please refer to the Current Medication list given to you today. ? ?*If you need a refill on your cardiac medications before your next appointment, please call your pharmacy* ? ? ?Lab Work: ?Your physician recommends that you return for lab work in:  ? ?Labs today: Lipids ? ?If you have labs (blood work) drawn today and your tests are completely normal, you will receive your results only by: ?MyChart Message (if you have MyChart) OR ?A paper copy in the mail ?If you have any lab test that is abnormal or we need to change your treatment, we will call you to review the results. ? ? ?Testing/Procedures: ?Your physician has requested that you have an echocardiogram. Echocardiography is a painless test that uses sound waves to create images of your heart. It provides your doctor with information about the size and shape of your heart and how well your heart?s chambers and valves are working. This procedure takes approximately one hour. There are no restrictions for this procedure. ? ? ? ?Follow-Up: ?At [facility], you and your health needs are our priority.  As part of our continuing mission to provide you with exceptional heart care, we have created designated Provider Care Teams.  These Care Teams include your primary Cardiologist (physician) and Advanced Practice Providers (APPs -  Physician Assistants and Nurse Practitioners) who all work together to provide you with the care you need, when you need it. ? ?We recommend signing up for the patient portal called "MyChart".  Sign up information is provided on this After Visit Summary.  MyChart is used to connect with patients for Virtual Visits (Telemedicine).  Patients are able to view lab/test results, encounter notes, upcoming appointments, etc.  Non-urgent messages can be sent to your provider as well.   ?To learn more about what you can do with MyChart,  go to [email or web address].   ? ?Your next appointment:   ?6 month(s) ? ?The format for your next appointment:   ?In Person ? ?Provider:   ?Gypsy Balsam, MD  ? ? ?Other Instructions ?None ? ?

## 2021-05-10 NOTE — Progress Notes (Signed)
?Cardiology Office Note:   ? ?Date:  05/10/2021  ? ?ID:  Marvin Jackson, DOB 07-30-1945, MRN [id number] ? ?PCP:  Elie Goody, NP  ?Cardiologist:  Gypsy Balsam, MD   ? ?Referring MD: Elie Goody, NP  ? ?Chief Complaint  ?Patient presents with  ? Follow-up  ?Doing fine ? ?History of Present Illness:   ? ?Marvin Jackson is a 76 y.o. 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 He underwent successful TAVR with a 29 mm Edwards Sapien 3 THV via the TF approach on 09/03/18. Post op echo showed EF 65%, mean gradient 14 mm hg and no PVL. He was discharged on POD1 ?Comes today for follow-up.  Overall seems to be doing well he does do fairly sedentary lifestyle.  Below knee right side amputation, he does have prosthesis that he use some.  He said he cooks and cleans a little bit at home denies have any cardiac complaints.  There is no chest pain tightness squeezing pressure mid chest there is no shortness of breath there is no palpitations no dizziness no passing out overall seems to be very satisfied and happy the way he is. ? ? ? ?Past Medical History:  ?Diagnosis Date  ? Anemia 02/02/2015  ? Arthritis   ? BMI 40.0-44.9, adult (HCC) 07/17/2017  ? Chronic diastolic heart failure (HCC) 08/29/2015  ? Coronary artery disease involving native coronary artery of native heart without angina pectoris   ? Heart Cath 02/2015 at [facility]    ? ED (erectile dysfunction) of organic origin   ? Encounter for annual wellness exam in Medicare patient 03/26/2019  ? Last Assessment & Plan:  Formatting of this note might be different from the original.  JAPHETH DIEKMAN was seen today for  Medicare Wellness Visit and Health Risk Assessment during which we reviewed patient completed history forms, Activities of Daily Living assessment, depression screening, health maintenance schedule (including appropriate vaccinations and screening tests/imaging), recent labs   ? Essential hypertension 02/02/2015  ? Gastroesophageal reflux disease without esophagitis   ? Generalized weakness 08/31/2020  ? History of total left knee replacement   ? Inguinal hernia   ? Limited mobility 08/05/2018  ? Lymphedema of both lower extremities   ? Morbid obesity (HCC)   ? Peripheral vascular disease, unspecified (HCC)   ? Pneumococcal vaccine refused 08/28/2016  ? Last Assessment & Plan:  Formatting of this note might be different from the original. He denies wanting vaccines; aware of benefits and risks of vaccinations; Formatting of this note might be different from the original. Last Assessment & Plan:  He denies wanting vaccines; aware of benefits and risks of vaccinations;  ? Prediabetes   ? Pure hypercholesterolemia 08/29/2015  ? S/P TAVR (transcatheter aortic valve replacement) 09/03/2018  ? 29 mm Edwards Sapien 3 Ultra transcatheter heart valve placed via percutaneous right transfemoral approach   ? Severe aortic stenosis   ? Status post below knee amputation of right lower extremity (HCC)   ? Vitamin D deficiency disease   ? Wheelchair dependence 12/13/2015  ? Last Assessment & Plan:  Formatting of this note might be different from the original. He is unable to stand and pivot to  transfer, uses slide board and other person assist;  He is wheelchair dependent due to R BKA as well as morbid obesity, multiple joint arthritis, deconditioning, as well as inability to bear entire weight on left hip and knee without increased fall risk and safety concerns;  Hi  ? ? ?Past Surgical History:  ?Procedure Laterality Date  ? BELOW KNEE LEG AMPUTATION Right 2017  ? [facility]  ? HERNIA REPAIR    ? JOINT REPLACEMENT    ? ORIF HUMERUS FRACTURE  Right 2016  ? ORIF PROXIMAL TIBIAL PLATEAU FRACTURE Right 2016  ? RIGHT/LEFT HEART CATH AND CORONARY ANGIOGRAPHY N/A 07/22/2018  ? Procedure: RIGHT/LEFT HEART CATH AND CORONARY ANGIOGRAPHY;  Surgeon: Tonny Bollman, MD;  [facility]: [facility] INVASIVE CV LAB;  Service: Cardiovascular;  Laterality: N/A;  ? TEE WITHOUT CARDIOVERSION N/A 09/03/2018  ? Procedure: TRANSESOPHAGEAL ECHOCARDIOGRAM (TEE);  Surgeon: Tonny Bollman, MD;  Location: [facility] INVASIVE CV LAB;  Service: Open Heart Surgery;  Laterality: N/A;  ? TOTAL KNEE ARTHROPLASTY Left   ? TRANSCATHETER AORTIC VALVE REPLACEMENT, TRANSFEMORAL N/A 09/03/2018  ? Procedure: TRANSCATHETER AORTIC VALVE REPLACEMENT, TRANSFEMORAL;  Surgeon: Tonny Bollman, MD;  Location: [facility] INVASIVE CV LAB;  Service: Open Heart Surgery;  Laterality: N/A;  ? ? ?Current Medications: ?Current Meds  ?Medication Sig  ? amLODipine (NORVASC) 10 MG tablet Take 10 mg by mouth daily.  ? aspirin 81 MG chewable tablet CHEW 1 TABLET BY MOUTH EVERY DAY (Patient taking differently: Chew 81 mg by mouth daily.)  ? Cholecalciferol 125 MCG (5000 UT) TABS Take 5,000 Units by mouth daily.  ? clopidogrel (PLAVIX) 75 MG tablet Take 1 tablet (75 mg total) by mouth daily. Patient must keep appointment on 05/10/21 for further refills.  ? D-Mannose 500 MG CAPS Take 1 tablet by mouth 2 (two) times daily.  ? docusate sodium (COLACE) 50 MG capsule Take by mouth.  ? finasteride (PROSCAR) 5 MG tablet   ? furosemide (LASIX) 40 MG tablet Take 80 mg by mouth daily.   ? lisinopril (PRINIVIL,ZESTRIL) 40 MG tablet Take 40 mg by mouth daily.  ? naproxen (NAPROSYN) 500 MG tablet Take by mouth.  ? pantoprazole (PROTONIX) 40 MG tablet Take 1 tablet (40 mg total) by mouth daily. Patient must keep appointment on 05/10/21 for further refills.  ? Potassium Chloride ER 20 MEQ TBCR   ? rosuvastatin (CRESTOR) 20 MG tablet Take 20 mg by mouth daily.   ? tamsulosin (FLOMAX) 0.4 MG CAPS capsule Take 0.4 mg by mouth daily.  ? vitamin B-12 (CYANOCOBALAMIN)  500 MCG tablet Take 500 mcg by mouth 2 (two) times a day.   ?  ? ?Allergies:   Patient has no known allergies.  ? ?Social History  ? ?Socioeconomic History  ? Marital status: Married  ?  Spouse name: Not on file  ? Number of children: Not on file  ? Years of education: Not on file  ? Highest education level: Not on file  ?Occupational History  ? Not on file  ?Tobacco Use  ? Smoking status: Never  ? Smokeless tobacco: Former  ?  Types: Chew, Snuff  ?Vaping Use  ? Vaping Use: Never used  ?Substance and Sexual Activity  ? Alcohol use: Not Currently  ? Drug use: Never  ? Sexual activity: Not on file  ?Other Topics Concern  ? Not on file  ?Social History Narrative  ? Not on file  ? ?Social Determinants of Health  ? ?Financial Resource Strain: Not on file  ?Food Insecurity: Not on  file  ?Transportation Needs: Not on file  ?Physical Activity: Not on file  ?Stress: Not on file  ?Social Connections: Not on file  ?  ? ?Family History: ?The patient's family history includes Cancer in his father; Heart disease in his father; Hypertension in his father and mother. ?ROS:   ?Please see the history of present illness.    ?All 14 point review of systems negative except as described per history of present illness ? ?EKGs/Labs/Other Studies Reviewed:   ? ? ? ?Recent Labs: ?No results found for requested labs within last 8760 hours.  ?Recent Lipid Panel ?No results found for: CHOL, TRIG, HDL, CHOLHDL, VLDL, LDLCALC, LDLDIRECT ? ?Physical Exam:   ? ?VS:  BP 134/76 (BP Location: Left Arm, Patient Position: Sitting)   Pulse (!) 5   Ht 6\' 3"  (1.905 m)   Wt (!) 335 lb (152 kg)   SpO2 96%   BMI 41.87 kg/m?    ? ?Wt Readings from Last 3 Encounters:  ?05/10/21 (!) 335 lb (152 kg)  ?11/12/19 (!) 340 lb (154.2 kg)  ?09/11/18 (!) 316 lb 3.2 oz (143.4 kg)  ?  ? ?GEN:  Well nourished, well developed in no acute distress ?HEENT: Normal ?NECK: No JVD; No carotid bruits ?LYMPHATICS: No lymphadenopathy ?CARDIAC: RRR, soft systolic ejection murmur  grade 2/6 best heard right upper portion of the sternum, tones are distant, no rubs, no gallops ?RESPIRATORY:  Clear to auscultation without rales, wheezing or rhonchi  ?ABDOMEN: Soft, non-tender, non-distende

## 2021-05-10 NOTE — Addendum Note (Signed)
Addended by: Roxanne Mins I on: 05/10/2021 08:27 AM ? ? Modules accepted: Orders ? ?

## 2021-05-11 ENCOUNTER — Ambulatory Visit (INDEPENDENT_AMBULATORY_CARE_PROVIDER_SITE_OTHER): Payer: Medicare Other

## 2021-05-11 DIAGNOSIS — Z89511 Acquired absence of right leg below knee: Secondary | ICD-10-CM

## 2021-05-11 DIAGNOSIS — I10 Essential (primary) hypertension: Secondary | ICD-10-CM | POA: Diagnosis not present

## 2021-05-11 DIAGNOSIS — S88111A Complete traumatic amputation at level between knee and ankle, right lower leg, initial encounter: Secondary | ICD-10-CM

## 2021-05-11 DIAGNOSIS — Z952 Presence of prosthetic heart valve: Secondary | ICD-10-CM | POA: Diagnosis not present

## 2021-05-11 DIAGNOSIS — I2510 Atherosclerotic heart disease of native coronary artery without angina pectoris: Secondary | ICD-10-CM | POA: Diagnosis not present

## 2021-05-11 LAB — LIPID PANEL
Chol/HDL Ratio: 3 ratio (ref 0.0–5.0)
Cholesterol, Total: 114 mg/dL (ref 100–199)
HDL: 38 mg/dL — ABNORMAL LOW (ref >39)
LDL Chol Calc (NIH): 59 mg/dL (ref 0–99)
Triglycerides: 83 mg/dL (ref 0–149)
VLDL Cholesterol Cal: 17 mg/dL (ref 5–40)

## 2021-05-11 LAB — ECHOCARDIOGRAM COMPLETE
AR max vel: 1.36 cm2
AV Area VTI: 1.4 cm2
AV Area mean vel: 1.33 cm2
AV Mean grad: 11 mmHg
AV Peak grad: 18.8 mmHg
Ao pk vel: 2.17 m/s
Area-P 1/2: 2.38 cm2
Calc EF: 59.5 %
S' Lateral: 2.7 cm
Single Plane A2C EF: 51.8 %
Single Plane A4C EF: 66.4 %

## 2021-05-12 ENCOUNTER — Other Ambulatory Visit: Payer: Self-pay | Admitting: Cardiology

## 2021-06-07 ENCOUNTER — Other Ambulatory Visit: Payer: Self-pay | Admitting: Cardiology

## 2021-09-07 ENCOUNTER — Other Ambulatory Visit: Payer: Self-pay | Admitting: Cardiology

## 2021-11-04 ENCOUNTER — Ambulatory Visit: Payer: Medicare Other | Attending: Cardiology | Admitting: Cardiology

## 2021-11-04 VITALS — BP 110/68 | HR 80 | Ht 75.0 in | Wt 315.0 lb

## 2021-11-04 DIAGNOSIS — E7800 Pure hypercholesterolemia, unspecified: Secondary | ICD-10-CM

## 2021-11-04 DIAGNOSIS — Z993 Dependence on wheelchair: Secondary | ICD-10-CM

## 2021-11-04 DIAGNOSIS — I10 Essential (primary) hypertension: Secondary | ICD-10-CM

## 2021-11-04 DIAGNOSIS — Z89511 Acquired absence of right leg below knee: Secondary | ICD-10-CM

## 2021-11-04 DIAGNOSIS — I2510 Atherosclerotic heart disease of native coronary artery without angina pectoris: Secondary | ICD-10-CM | POA: Diagnosis not present

## 2021-11-04 DIAGNOSIS — K219 Gastro-esophageal reflux disease without esophagitis: Secondary | ICD-10-CM | POA: Diagnosis not present

## 2021-11-04 NOTE — Patient Instructions (Signed)

## 2021-11-04 NOTE — Progress Notes (Unsigned)
Cardiology Office Note:    Date:  11/04/2021   ID:  Marvin Jackson, DOB [birth year], MRN [id number]  PCP:  Elie Goody, NP  Cardiologist:  Gypsy Balsam, MD    Referring MD: Elie Goody, NP   Chief Complaint  Patient presents with   Follow-up  Doing fine  History of Present Illness:    Marvin Jackson is a 76 y.o. 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 He underwent successful TAVR with a 29 mm Edwards Sapien 3 THV via the TF approach on 09/03/18. Post op echo showed EF 65%, mean gradient 14 mm hg and no PVL.  Comes today to my office for follow-up.  Overall he is doing very well.  Denies of any chest pain tightness squeezing pressure pain chest no cardiac complaints he does have some infection of the skin of his abdominal wall is been take antibiotic for it.  Past Medical History:  Diagnosis Date   Anemia 02/02/2015   Arthritis    BMI 40.0-44.9, adult (HCC) 07/17/2017   Chronic diastolic heart failure (HCC) 08/29/2015   Coronary artery disease involving native coronary artery of native heart without angina pectoris    Heart Cath 02/2015 at [facility]     ED (erectile dysfunction) of organic origin    Encounter for annual wellness exam in Medicare patient 03/26/2019   Last Assessment & Plan:  Formatting of this note might be different from the original.  Marvin Jackson was seen today for Medicare Wellness Visit and Health Risk Assessment during which we reviewed patient completed history forms, Activities of Daily Living assessment, depression screening, health maintenance schedule (including appropriate vaccinations and screening  tests/imaging), recent labs    Essential hypertension 02/02/2015   Gastroesophageal reflux disease without esophagitis    Generalized weakness 08/31/2020   History of total left knee replacement    Inguinal hernia    Limited mobility 08/05/2018   Lymphedema of both lower extremities    Morbid obesity (HCC)    Peripheral vascular disease, unspecified (HCC)    Pneumococcal vaccine refused 08/28/2016   Last Assessment & Plan:  Formatting of this note might be different from the original. He denies wanting vaccines; aware of benefits and risks of vaccinations; Formatting of this note might be different from the original. Last Assessment & Plan:  He denies wanting vaccines; aware of benefits and risks of vaccinations;   Prediabetes    Pure hypercholesterolemia 08/29/2015   S/P TAVR (transcatheter aortic valve replacement) 09/03/2018   29 mm Edwards Sapien 3 Ultra transcatheter heart valve placed via percutaneous right transfemoral approach    Severe aortic stenosis    Status post below knee amputation of right lower extremity (HCC)    Vitamin D deficiency disease    Wheelchair dependence 12/13/2015   Last Assessment & Plan:  Formatting of this note might be different from the original. He is unable to stand and pivot to transfer, uses slide board and other person assist;  He is wheelchair dependent due to R BKA as well as morbid obesity, multiple joint arthritis, deconditioning, as well as inability to bear entire weight on left hip and knee without increased fall risk and safety concerns;  Hi    Past Surgical History:  Procedure Laterality Date   BELOW KNEE LEG AMPUTATION Right 2017   [facility]   HERNIA REPAIR     JOINT REPLACEMENT     ORIF HUMERUS FRACTURE Right 2016   ORIF PROXIMAL TIBIAL PLATEAU FRACTURE Right 2016   RIGHT/LEFT HEART CATH AND CORONARY ANGIOGRAPHY N/A 07/22/2018   Procedure: RIGHT/LEFT HEART CATH AND CORONARY ANGIOGRAPHY;  Surgeon: Sherren Mocha, MD;  Location: [place] [facility];  Service: Cardiovascular;  Laterality: N/A;   TEE WITHOUT CARDIOVERSION N/A 09/03/2018   Procedure: TRANSESOPHAGEAL ECHOCARDIOGRAM (TEE);  Surgeon: Sherren Mocha, MD;  Location: [place] [facility];  Service: Open Heart Surgery;  Laterality: N/A;   TOTAL KNEE ARTHROPLASTY Left    TRANSCATHETER AORTIC VALVE REPLACEMENT, TRANSFEMORAL N/A 09/03/2018   Procedure: TRANSCATHETER AORTIC VALVE REPLACEMENT, TRANSFEMORAL;  Surgeon: Sherren Mocha, MD;  Location: [place] [facility];  Service: Open Heart Surgery;  Laterality: N/A;    Current Medications: Current Meds  Medication Sig   amLODipine (NORVASC) 10 MG tablet Take 10 mg by mouth daily.   aspirin ([employer] ASPIRIN ADULT LOW DOSE) 81 MG chewable tablet CHEW 1 TABLET BY MOUTH EVERY DAY   Cholecalciferol 125 MCG (5000 UT) TABS Take 5,000 Units by mouth daily.   clopidogrel (PLAVIX) 75 MG tablet Take 1 tablet (75 mg total) by mouth daily.   D-Mannose 500 MG CAPS Take 1 tablet by mouth 2 (two) times daily.   docusate sodium (COLACE) 50 MG capsule Take by mouth.   finasteride (PROSCAR) 5 MG tablet    furosemide (LASIX) 40 MG tablet Take 80 mg by mouth daily.    lisinopril (PRINIVIL,ZESTRIL) 40 MG tablet Take 40 mg by mouth daily.   naproxen (NAPROSYN) 500 MG tablet Take by mouth.   pantoprazole (PROTONIX) 40 MG tablet Take 1 tablet (40 mg total) by mouth daily.   Potassium Chloride ER 20 MEQ TBCR    rosuvastatin (CRESTOR) 20 MG tablet Take 20 mg by mouth daily.    tamsulosin (FLOMAX) 0.4 MG CAPS capsule Take 0.4 mg by mouth daily.   vitamin B-12 (CYANOCOBALAMIN) 500 MCG tablet Take 500 mcg by mouth 2 (two) times a day.      Allergies:   Patient has no known allergies.   Social History   Socioeconomic History   Marital status: Married    Spouse name: Not on file   Number of children: Not on file   Years of education: Not on file   Highest education level: Not on file  Occupational History   Not on file  Tobacco Use   Smoking status:  Never   Smokeless tobacco: Former    Types: Chew, Snuff  Vaping Use   Vaping Use: Never used  Substance and Sexual Activity   Alcohol use: Not Currently   Drug use: Never   Sexual activity: Not on file  Other Topics Concern   Not on file  Social History Narrative   Not on file   Social Determinants of Health   Financial Resource Strain: Not on file  Food Insecurity: Not on file  Transportation Needs: Not on file  Physical Activity: Not on file  Stress: Not on file  Social Connections: Not on file     Family History: The patient's family history includes Cancer in his father; Heart disease in his father; Hypertension in his father and mother. ROS:   Please see the history of present illness.    All 14 point review of systems  negative except as described per history of present illness  EKGs/Labs/Other Studies Reviewed:      Recent Labs: No results found for requested labs within last 365 days.  Recent Lipid Panel    Component Value Date/Time   CHOL 114 05/10/2021 0828   TRIG 83 05/10/2021 0828   HDL 38 (L) 05/10/2021 0828   CHOLHDL 3.0 05/10/2021 0828   LDLCALC 59 05/10/2021 0828    Physical Exam:    VS:  BP 110/68 (BP Location: Left Arm, Patient Position: Sitting)   Pulse 80   Ht 6\' 3"  (1.905 m)   Wt (!) 315 lb (142.9 kg)   SpO2 95%   BMI 39.37 kg/m     Wt Readings from Last 3 Encounters:  11/04/21 (!) 315 lb (142.9 kg)  05/10/21 (!) 335 lb (152 kg)  11/12/19 (!) 340 lb (154.2 kg)     GEN:  Well nourished, well developed in no acute distress HEENT: Normal NECK: No JVD; No carotid bruits LYMPHATICS: No lymphadenopathy CARDIAC: RRR, no murmurs, no rubs, no gallops RESPIRATORY:  Clear to auscultation without rales, wheezing or rhonchi  ABDOMEN: Soft, non-tender, non-distended MUSCULOSKELETAL:  No edema; No deformity  SKIN: Warm and dry LOWER EXTREMITIES: no swelling NEUROLOGIC:  Alert and oriented x 3 PSYCHIATRIC:  Normal affect   ASSESSMENT:     1. Coronary artery disease involving native coronary artery of native heart without angina pectoris   2. Gastroesophageal reflux disease without esophagitis   3. Hx of right BKA (HCC)   4. Pure hypercholesterolemia   5. Wheelchair dependence   6. Essential hypertension    PLAN:    In order of problems listed above:  Coronary disease stable from that point review on appropriate medications will continue.  Advanced disease with diffuse 70% stenosis of the LAD severe 80% stenosis of RCA 80% stenosis of ramus intermedius but because of his comorbidities he was disqualified as a candidate for surgery and I have to admit he is doing quite well Dyslipidemia he is on high intense statin Crestor which I will continue.  I did review his K PN which show me his LDL of 9 HDL 38 this is data from April of this year continue present management Essential hypertension blood pressure well controlled continue present management Status post TAVI.  Echocardiogram reviewed done in spring of this year normal function we will continue present management   Medication Adjustments/Labs and Tests Ordered: Current medicines are reviewed at length with the patient today.  Concerns regarding medicines are outlined above.  No orders of the defined types were placed in this encounter.  Medication changes: No orders of the defined types were placed in this encounter.   Signed, 08-28-1990, MD, [facility] 11/04/2021 1:06 PM    [facility]

## 2021-12-05 ENCOUNTER — Other Ambulatory Visit: Payer: Self-pay | Admitting: Cardiology

## 2021-12-05 NOTE — Telephone Encounter (Signed)
Rx refill sent to pharmacy. 

## 2022-04-18 ENCOUNTER — Other Ambulatory Visit: Payer: Self-pay | Admitting: Cardiology

## 2022-05-18 ENCOUNTER — Other Ambulatory Visit: Payer: Self-pay | Admitting: Cardiology

## 2022-05-18 NOTE — Telephone Encounter (Signed)
Rx to pharmacy, needs appointment for future refills / 1st attempt

## 2022-05-20 DIAGNOSIS — I2510 Atherosclerotic heart disease of native coronary artery without angina pectoris: Secondary | ICD-10-CM | POA: Diagnosis not present

## 2022-05-20 DIAGNOSIS — A419 Sepsis, unspecified organism: Secondary | ICD-10-CM | POA: Diagnosis not present

## 2022-05-20 DIAGNOSIS — I5030 Unspecified diastolic (congestive) heart failure: Secondary | ICD-10-CM | POA: Diagnosis not present

## 2022-07-07 ENCOUNTER — Other Ambulatory Visit: Payer: Self-pay | Admitting: Cardiology

## 2022-07-08 DEATH — deceased

## 2022-08-14 ENCOUNTER — Other Ambulatory Visit: Payer: Self-pay | Admitting: Cardiology

## 2022-08-22 ENCOUNTER — Other Ambulatory Visit: Payer: Self-pay | Admitting: Cardiology
